# Patient Record
Sex: Female | Born: 1985
Health system: Southern US, Community
[De-identification: ages and names within clinical notes are randomized; demographics above are authoritative.]

## PROBLEM LIST (undated history)

## (undated) DIAGNOSIS — E559 Vitamin D deficiency, unspecified: Secondary | ICD-10-CM

## (undated) DIAGNOSIS — O1495 Unspecified pre-eclampsia, complicating the puerperium: Secondary | ICD-10-CM

## (undated) DIAGNOSIS — E7800 Pure hypercholesterolemia, unspecified: Secondary | ICD-10-CM

## (undated) DIAGNOSIS — F319 Bipolar disorder, unspecified: Secondary | ICD-10-CM

## (undated) DIAGNOSIS — Z803 Family history of malignant neoplasm of breast: Secondary | ICD-10-CM

## (undated) DIAGNOSIS — F419 Anxiety disorder, unspecified: Secondary | ICD-10-CM

## (undated) DIAGNOSIS — M791 Myalgia, unspecified site: Secondary | ICD-10-CM

## (undated) DIAGNOSIS — J302 Other seasonal allergic rhinitis: Secondary | ICD-10-CM

## (undated) DIAGNOSIS — I82402 Acute embolism and thrombosis of unspecified deep veins of left lower extremity: Secondary | ICD-10-CM

## (undated) DIAGNOSIS — Z1379 Encounter for other screening for genetic and chromosomal anomalies: Secondary | ICD-10-CM

## (undated) HISTORY — DX: Myalgia, unspecified site: M79.10

## (undated) HISTORY — DX: Encounter for other screening for genetic and chromosomal anomalies: Z13.79

## (undated) HISTORY — DX: Family history of malignant neoplasm of breast: Z80.3

## (undated) HISTORY — DX: Vitamin D deficiency, unspecified: E55.9

## (undated) HISTORY — PX: SALPINGECTOMY: SHX328

## (undated) HISTORY — PX: ABDOMINAL HYSTERECTOMY: SHX81

## (undated) HISTORY — DX: Pure hypercholesterolemia, unspecified: E78.00

## (undated) HISTORY — DX: Anxiety disorder, unspecified: F41.9

## (undated) HISTORY — DX: Bipolar disorder, unspecified: F31.9

## (undated) HISTORY — PX: TONSILLECTOMY AND ADENOIDECTOMY: SHX28

---
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
## 2007-01-19 ENCOUNTER — Emergency Department (HOSPITAL_COMMUNITY): Admission: EM | Admit: 2007-01-19 | Discharge: 2007-01-20 | Payer: Self-pay | Admitting: Emergency Medicine

## 2008-02-08 ENCOUNTER — Emergency Department (HOSPITAL_COMMUNITY): Admission: EM | Admit: 2008-02-08 | Discharge: 2008-02-08 | Payer: Self-pay | Admitting: Emergency Medicine

## 2008-07-28 ENCOUNTER — Emergency Department (HOSPITAL_COMMUNITY): Admission: EM | Admit: 2008-07-28 | Discharge: 2008-07-28 | Payer: Self-pay | Admitting: Emergency Medicine

## 2009-07-12 ENCOUNTER — Emergency Department (HOSPITAL_COMMUNITY): Admission: EM | Admit: 2009-07-12 | Discharge: 2009-07-12 | Payer: Self-pay | Admitting: Emergency Medicine

## 2009-12-04 ENCOUNTER — Emergency Department (HOSPITAL_COMMUNITY): Admission: EM | Admit: 2009-12-04 | Discharge: 2009-12-05 | Payer: Self-pay | Admitting: Emergency Medicine

## 2010-02-01 ENCOUNTER — Emergency Department (HOSPITAL_COMMUNITY)
Admission: EM | Admit: 2010-02-01 | Discharge: 2010-02-01 | Payer: Self-pay | Source: Home / Self Care | Admitting: Emergency Medicine

## 2010-04-26 ENCOUNTER — Emergency Department (HOSPITAL_COMMUNITY)
Admission: EM | Admit: 2010-04-26 | Discharge: 2010-04-26 | Payer: Self-pay | Source: Home / Self Care | Admitting: Emergency Medicine

## 2010-07-25 LAB — URINALYSIS, ROUTINE W REFLEX MICROSCOPIC
Hgb urine dipstick: NEGATIVE
Ketones, ur: NEGATIVE mg/dL
Urobilinogen, UA: 0.2 mg/dL (ref 0.0–1.0)
pH: 6 (ref 5.0–8.0)

## 2010-07-25 LAB — BASIC METABOLIC PANEL
BUN: 22 mg/dL (ref 6–23)
CO2: 26 mEq/L (ref 19–32)
Calcium: 8.7 mg/dL (ref 8.4–10.5)
Chloride: 101 mEq/L (ref 96–112)
Creatinine, Ser: 0.95 mg/dL (ref 0.4–1.2)
GFR calc non Af Amer: >60 mL/min (ref >60)
Glucose, Bld: 110 mg/dL — ABNORMAL HIGH (ref 70–99)
Potassium: 3.4 mEq/L — ABNORMAL LOW (ref 3.5–5.1)

## 2010-07-25 LAB — HEPATIC FUNCTION PANEL
Alkaline Phosphatase: 33 U/L — ABNORMAL LOW (ref 39–117)
Bilirubin, Direct: 0.1 mg/dL (ref 0.0–0.3)
Indirect Bilirubin: 0.4 mg/dL (ref 0.3–0.9)
Total Bilirubin: 0.5 mg/dL (ref 0.3–1.2)

## 2010-07-25 LAB — DIFFERENTIAL
Eosinophils Absolute: 0 10*3/uL (ref 0.0–0.7)
Eosinophils Relative: 1 % (ref 0–5)
Lymphs Abs: 0.8 10*3/uL (ref 0.7–4.0)
Monocytes Relative: 10 % (ref 3–12)
Neutro Abs: 1.9 10*3/uL (ref 1.7–7.7)
Neutrophils Relative %: 63 % (ref 43–77)

## 2010-07-25 LAB — CBC
MCV: 93.9 fL (ref 78.0–100.0)
Platelets: 184 10*3/uL (ref 150–400)
WBC: 3.1 10*3/uL — ABNORMAL LOW (ref 4.0–10.5)

## 2010-07-25 LAB — POCT PREGNANCY, URINE: Preg Test, Ur: NEGATIVE

## 2010-07-25 LAB — URINE MICROSCOPIC-ADD ON

## 2010-07-25 LAB — LIPASE, BLOOD: Lipase: 25 U/L (ref 11–59)

## 2010-07-27 LAB — URINALYSIS, ROUTINE W REFLEX MICROSCOPIC
Glucose, UA: NEGATIVE mg/dL
Hgb urine dipstick: NEGATIVE
Protein, ur: NEGATIVE mg/dL
Specific Gravity, Urine: 1.031 — ABNORMAL HIGH (ref 1.005–1.030)
Urobilinogen, UA: 0.2 mg/dL (ref 0.0–1.0)
pH: 6 (ref 5.0–8.0)

## 2010-07-27 LAB — POCT I-STAT, CHEM 8
BUN: 20 mg/dL (ref 6–23)
Calcium, Ion: 1.17 mmol/L (ref 1.12–1.32)
Chloride: 105 mEq/L (ref 96–112)
Creatinine, Ser: 0.7 mg/dL (ref 0.4–1.2)
Glucose, Bld: 82 mg/dL (ref 70–99)
HCT: 42 % (ref 36.0–46.0)
Hemoglobin: 14.3 g/dL (ref 12.0–15.0)
Potassium: 3.6 meq/L (ref 3.5–5.1)
Sodium: 139 meq/L (ref 135–145)
TCO2: 27 mmol/L (ref 0–100)

## 2010-07-27 LAB — URINE MICROSCOPIC-ADD ON

## 2010-07-27 LAB — SAMPLE TO BLOOD BANK

## 2010-07-27 LAB — POCT PREGNANCY, URINE: Preg Test, Ur: NEGATIVE

## 2010-07-29 LAB — DIFFERENTIAL
Basophils Absolute: 0 10*3/uL (ref 0.0–0.1)
Basophils Relative: 0 % (ref 0–1)
Eosinophils Absolute: 0.1 10*3/uL (ref 0.0–0.7)
Lymphs Abs: 1 10*3/uL (ref 0.7–4.0)
Monocytes Relative: 2 % — ABNORMAL LOW (ref 3–12)
Neutrophils Relative %: 89 % — ABNORMAL HIGH (ref 43–77)

## 2010-07-29 LAB — URINALYSIS, ROUTINE W REFLEX MICROSCOPIC
Ketones, ur: NEGATIVE mg/dL
Nitrite: NEGATIVE
Specific Gravity, Urine: 1.017 (ref 1.005–1.030)
pH: 5.5 (ref 5.0–8.0)

## 2010-07-29 LAB — BASIC METABOLIC PANEL
CO2: 31 mEq/L (ref 19–32)
Creatinine, Ser: 0.77 mg/dL (ref 0.4–1.2)
GFR calc Af Amer: >60 mL/min (ref >60)
GFR calc non Af Amer: >60 mL/min (ref >60)
Sodium: 138 mEq/L (ref 135–145)

## 2010-07-29 LAB — CBC
HCT: 37.9 % (ref 36.0–46.0)
Hemoglobin: 13.5 g/dL (ref 12.0–15.0)
MCH: 34.3 pg — ABNORMAL HIGH (ref 26.0–34.0)
WBC: 13.5 10*3/uL — ABNORMAL HIGH (ref 4.0–10.5)

## 2010-07-29 LAB — URINE MICROSCOPIC-ADD ON

## 2010-07-29 LAB — RAPID STREP SCREEN (MED CTR MEBANE ONLY): Streptococcus, Group A Screen (Direct): NEGATIVE

## 2010-10-30 ENCOUNTER — Emergency Department (HOSPITAL_COMMUNITY)
Admission: EM | Admit: 2010-10-30 | Discharge: 2010-10-30 | Disposition: A | Discharge status: Home / Self Care | Payer: Managed Care, Other (non HMO) | Attending: Emergency Medicine | Admitting: Emergency Medicine

## 2010-10-30 DIAGNOSIS — X58XXXA Exposure to other specified factors, initial encounter: Secondary | ICD-10-CM | POA: Insufficient documentation

## 2010-10-30 DIAGNOSIS — L299 Pruritus, unspecified: Secondary | ICD-10-CM | POA: Insufficient documentation

## 2010-10-30 DIAGNOSIS — F319 Bipolar disorder, unspecified: Secondary | ICD-10-CM | POA: Insufficient documentation

## 2010-10-30 DIAGNOSIS — R21 Rash and other nonspecific skin eruption: Secondary | ICD-10-CM | POA: Insufficient documentation

## 2010-10-30 DIAGNOSIS — T7840XA Allergy, unspecified, initial encounter: Secondary | ICD-10-CM | POA: Insufficient documentation

## 2010-10-31 ENCOUNTER — Inpatient Hospital Stay (HOSPITAL_COMMUNITY)
Admission: EM | Admit: 2010-10-31 | Discharge: 2010-11-03 | DRG: 607 | Disposition: A | Discharge status: Home / Self Care | Payer: Managed Care, Other (non HMO) | Attending: Internal Medicine | Admitting: Internal Medicine

## 2010-10-31 ENCOUNTER — Emergency Department (HOSPITAL_COMMUNITY)
Admission: EM | Admit: 2010-10-31 | Discharge: 2010-10-31 | Disposition: A | Discharge status: Home / Self Care | Payer: Managed Care, Other (non HMO) | Source: Home / Self Care | Attending: Emergency Medicine | Admitting: Emergency Medicine

## 2010-10-31 DIAGNOSIS — F411 Generalized anxiety disorder: Secondary | ICD-10-CM | POA: Diagnosis present

## 2010-10-31 DIAGNOSIS — T380X5A Adverse effect of glucocorticoids and synthetic analogues, initial encounter: Secondary | ICD-10-CM | POA: Diagnosis present

## 2010-10-31 DIAGNOSIS — L500 Allergic urticaria: Secondary | ICD-10-CM | POA: Insufficient documentation

## 2010-10-31 DIAGNOSIS — E876 Hypokalemia: Secondary | ICD-10-CM | POA: Diagnosis not present

## 2010-10-31 DIAGNOSIS — R7309 Other abnormal glucose: Secondary | ICD-10-CM | POA: Diagnosis present

## 2010-10-31 DIAGNOSIS — F319 Bipolar disorder, unspecified: Secondary | ICD-10-CM | POA: Diagnosis present

## 2010-11-01 LAB — BASIC METABOLIC PANEL
BUN: 18 mg/dL (ref 6–23)
BUN: 19 mg/dL (ref 6–23)
Creatinine, Ser: 0.59 mg/dL (ref 0.50–1.10)
Creatinine, Ser: 0.59 mg/dL (ref 0.50–1.10)
GFR calc Af Amer: >60 mL/min (ref >60)
GFR calc Af Amer: >60 mL/min (ref >60)
GFR calc non Af Amer: >60 mL/min (ref >60)
GFR calc non Af Amer: >60 mL/min (ref >60)
Potassium: 3.5 mEq/L (ref 3.5–5.1)

## 2010-11-01 LAB — DIFFERENTIAL
Basophils Absolute: 0 10*3/uL (ref 0.0–0.1)
Basophils Absolute: 0 10*3/uL (ref 0.0–0.1)
Basophils Relative: 0 % (ref 0–1)
Basophils Relative: 0 % (ref 0–1)
Eosinophils Absolute: 0 10*3/uL (ref 0.0–0.7)
Eosinophils Absolute: 0 10*3/uL (ref 0.0–0.7)
Eosinophils Relative: 0 % (ref 0–5)
Monocytes Relative: 4 % (ref 3–12)
Neutro Abs: 10.8 10*3/uL — ABNORMAL HIGH (ref 1.7–7.7)

## 2010-11-01 LAB — CBC
HCT: 36.5 % (ref 36.0–46.0)
MCHC: 35.6 g/dL (ref 30.0–36.0)
MCV: 92.9 fL (ref 78.0–100.0)
MCV: 94.6 fL (ref 78.0–100.0)
Platelets: 178 10*3/uL (ref 150–400)
Platelets: 205 10*3/uL (ref 150–400)
RDW: 11.4 % — ABNORMAL LOW (ref 11.5–15.5)
RDW: 11.5 % (ref 11.5–15.5)
WBC: 12.7 10*3/uL — ABNORMAL HIGH (ref 4.0–10.5)

## 2010-11-02 LAB — GLUCOSE, CAPILLARY: Glucose-Capillary: 122 mg/dL — ABNORMAL HIGH (ref 70–99)

## 2010-11-03 LAB — CBC
HCT: 35.7 % — ABNORMAL LOW (ref 36.0–46.0)
Hemoglobin: 12.1 g/dL (ref 12.0–15.0)
MCV: 96.5 fL (ref 78.0–100.0)
RBC: 3.7 MIL/uL — ABNORMAL LOW (ref 3.87–5.11)
RDW: 11.5 % (ref 11.5–15.5)
WBC: 7.6 10*3/uL (ref 4.0–10.5)

## 2010-11-03 LAB — GLUCOSE, CAPILLARY: Glucose-Capillary: 140 mg/dL — ABNORMAL HIGH (ref 70–99)

## 2010-11-03 LAB — BASIC METABOLIC PANEL
BUN: 13 mg/dL (ref 6–23)
Chloride: 105 mEq/L (ref 96–112)
GFR calc Af Amer: >60 mL/min (ref >60)
Potassium: 4.3 mEq/L (ref 3.5–5.1)

## 2010-11-03 LAB — DIFFERENTIAL
Eosinophils Relative: 0 % (ref 0–5)
Lymphocytes Relative: 10 % — ABNORMAL LOW (ref 12–46)
Lymphs Abs: 0.8 10*3/uL (ref 0.7–4.0)

## 2010-11-07 NOTE — H&P (Signed)
[name], [name] NO.:  [id number]  MEDICAL RECORD NO.:  [id number]  LOCATION:  IC04                          FACILITY:  [facility]  PHYSICIAN:  Vania Rea, M.D. DATE OF BIRTH:  [birth year]  DATE OF ADMISSION:  11/01/2010 DATE OF DISCHARGE:  LH                             HISTORY & PHYSICAL   PRIMARY CARE PHYSICIAN:  Madelin Rear. Sherwood Gambler, MD, at [facility].  PSYCHIATRIST:  Dr. Rowland Lathe, [facility] in [place].  CHIEF COMPLAINT:  Persistent allergy.  HISTORY OF PRESENT ILLNESS:  This is a 25 year old Caucasian lady who works as a Fish farm manager who presented to [facility] on October 30, 2010, two days ago complaining of sudden onset of itching and swelling of her skin after wrestling with a dog.  The patient has no prior history of urticaria that she can remember and she has been working with animals for the past 2 years.  At [facility], she received injections of Benadryl and Pepcid and was given a prescription for prednisone 40 mg daily.  The following morning the patient left to work, said she could not breathe, was having tightness and burning inside her lungs.  EMS was called and she received Zantac and Benadryl intravenously en route to the emergency room.  She was seen at the [facility] Emergency Room.  She was found to be having no objective respiratory distress, although subjectively she complained of being short of breath.  She had no oropharyngeal edema.  It was confirmed that she had received antihistamines that day, but she had not taken any prednisone that morning.  She was reassured, advised to continue prednisone, Benadryl, and Pepcid and to have outpatient followup for allergy testing.  The patient later that day continued to feel worse, continued to have itching and hives, and went to her primary care physician's office, Dr. Sherwood Gambler where she was evaluated, given a shot with an EpiPen and she said she  felt much better after that and she was discharged home, but later the same evening again after feeling very sick, had more hive, swelling, and itching and returned to the emergency room once again for evaluation.  The patient has been given intravenous Benadryl and the Hospitalist Service has been called to assist with management.  The patient insists she has been taking the medications as prescribed. She does, however, note that she has an allergy to STEROIDS, particular CORTISONE.  She says it was many years ago and she does not know the details.  The computer also lists her as having an allergy to the NEUROMUSCULAR BLOCKING DRUGS, but she knows nothing of the details of this.  She denies any fever or cough.  There is no history of cyanosis.  The patient lives with her mother who is currently on vacation in [place] and with this visit she is being attended by her godmother.  PAST MEDICAL HISTORY:  Bipolar disorder.  MEDICATIONS:  Abilify 30 mg each evening and Trileptal 1200 mg each evening.  ALLERGIES:  Allergies to STEROIDS and Neuromuscular Blocking Drugs.  SOCIAL HISTORY:  Denies tobacco, alcohol, or illicit drug use.  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  FAMILY HISTORY:  She denies any family medical problems at all.  REVIEW OF SYSTEMS:  Other than noted above unremarkable.  PHYSICAL EXAMINATION:  GENERAL:  Very anxious looking young Caucasian lady insisting that she is getting worse. VITAL SIGNS:  Her temperature is 97.9, pulse 88, respirations 16, and blood pressure 120/49.  She is saturating at 100% on room air. HEENT:  Her pupils are round and equal.  Mucous membranes pink. Anicteric.  She has no oropharyngeal edema. NECK:  No cervical lymphadenopathy.  No thyromegaly.  No carotid bruit. CHEST:  Clear to auscultation bilaterally. PSYCH:  She speaks comfortably in long sentences, although she is anxious. CARDIOVASCULAR:  She has a regular rhythm and  a 3/6 rough systolic murmur at the apex radiating to right upper sternal border. ABDOMEN:  Scaphoid, soft, and nontender. EXTREMITIES:  Without edema.  She has 2+ dorsalis pedis pulses bilaterally. SKIN:  She has generalized urticaria, more pronounced on her face and generalized erythema more pronounced on her limbs. CENTRAL NERVOUS SYSTEM:  Cranial nerves II-XII are grossly intact.  She has no focal lateralizing signs.  LABORATORY DATA:  Her white count is 12.6, hemoglobin 13.0, and platelets 205.  She has 86% neutrophils.  Absolute neutrophil count is 10.8.  She has no detected eosinophils.  Her sodium is 136, potassium 3.5, chloride 101, CO2 of 24, glucose 136, BUN 9, creatinine 0.59, and calcium 9.8.  No imaging studies have been done.  ASSESSMENT: 1. Acute allergic reaction, symptoms probably aggravated by anxiety. 2. History of STEROID allergy, although allergic reaction is usually     psychiatric symptoms.  It is possible that her allergic reaction is     currently being aggravated by prednisone. 3. Bipolar disorder, currently with acute anxiety.  PLAN: 1. We will bring this lady on observation.  We will withhold any     steroids at this time, but will give her scheduled H1 and H2     blockers.  We will monitor her in the Step-Down Unit in case     development of any respiratory     distress.  If she demonstrates that she can be controlled with H1     and H2 blockers, she can likely be discharged home without any     steroids, to follow up with an allergist for allergy testing. 2. Other plans as per orders.     Vania Rea, M.D.     LC/MEDQ  D:  11/01/2010  T:  11/01/2010  Job:  [id number]  cc:   Madelin Rear. Sherwood Gambler, MD Fax: [phone]  Electronically Signed by Vania Rea M.D. on 11/07/2010 04:54:29 AM

## 2010-11-07 NOTE — Discharge Summary (Signed)
[name], [name]                  ACCOUNT NO.:  [id number]  MEDICAL RECORD NO.:  [id number]  LOCATION:  [id number]                          FACILITY:  [facility]  PHYSICIAN:  Elliot Cousin, M.D.    DATE OF BIRTH:  1986/05/06  DATE OF ADMISSION:  10/31/2010 DATE OF DISCHARGE:  06/22/2012LH                              DISCHARGE SUMMARY   DISCHARGE DIAGNOSES: 1. Recurrent allergic reaction manifested by pruritus and urticaria. 2. Hypokalemia, repleted. 3. Bipolar disorder with anxiety, remained stable. 4. Mild hyperglycemia, steroid-induced.  DISCHARGE MEDICATIONS: 1. EpiPen to be taken as directed. 2. Hydroxyzine 50 mg every 6 hours until your symptoms completely go     away or per the recommendations of the allergist. 3. Medrol taper with 4 mg tablets to be tapered over the next 6 days. 4. Pepcid 10 mg two tablets b.i.d. for 6 more days or as directed. 5. Abilify 30 mg each evening. 6. Trileptal 600 mg 2 tablets every evening.  DISCHARGE DISPOSITION:  The patient was discharged to home in improved and stable condition on November 03, 2010.  She will follow up with allergist, Dr. Beaulah Dinning on Monday November 06, 2010, at 8:30 a.m.  CONSULTATIONS:  None.  PROCEDURE PERFORMED:  None.  HISTORY OF PRESENT ILLNESS:  The patient is a 25 year old woman with a past medical history significant for bipolar disorder, who presented to the emergency department on October 31, 2010, with a chief complaint of recurrent urticarial rash and itching.  The patient initially presented to the emergency department on October 30, 2010, complaining of a sudden onset of itching and swelling of her skin after wrestling with a dog at the veterinarian.  The patient is employed as a Patent attorney. The dog also scratched her.  Prior to this interaction, she had no prior history of allergic reactions of any sort.  In fact, she has a dog.  She has been working as a Patent attorney for the past 2 years.  In the  emergency department initially, she received injections of Benadryl and Pepcid.  She was given a prescription for prednisone taper.  The following morning, she complained of shortness of breath and tightness and burning in her lungs.  EMS was called.  She received Zantac and Benadryl intravenously en route to the emergency department.  She was taken to [facility] Emergency Department. There was no evidence of oropharyngeal edema.  She was given reassurance and advised to continue prednisone, Benadryl, and Pepcid which had been prescribed the previous day.  Later on that day, she continued to have itching and hives.  She presented to her primary care physician Dr. Sherwood Gambler.  He gave her a shot of an EpiPen.  She felt better and returned home.  But later in the evening, her symptoms recurred and she presented again to the emergency department at [facility].  For further details please see the dictated history and physical.  HOSPITAL COURSE:  The patient was once again started on standard treatment for allergic reactions with intravenous Pepcid 20 mg IV q.12 h, intravenous Benadryl 25 mg IV q.6 h, and Solu-Medrol 60 mg IV q.12 h. Benadryl was eventually discontinued in  favor of hydroxyzine. Hydroxyzine was given orally at 50 mg every 6 hours.  Over the course of the hospitalization, the extent of her urticarial lesions and pruritus subsided and then eventually resolved.  At the time of discharge, she had no evidence of urticaria, although there were a few macular areas on both feet.  There was no evidence of oropharyngeal edema or erythema.  The urticarial rash on her legs, on her face and on her torso completely resolved prior to discharge.  Due to the treatment with Solu-Medrol, her capillary blood glucose did increase.  She was treated appropriately with sliding scale NovoLog. She was also noted to have hypokalemia.  She was repleted orally.  Prior to discharge, her  serum potassium improved to 4.3.  She has chronic anxiety with bipolar disorder.  She was maintained on her chronic medications.  There was no exacerbation or decompensation of her bipolar disorder.  The patient does not know what caused the recurrent or persistent allergic reaction manifested by pruritus and urticaria.  She stated that her symptoms started after she was scratched by a dog at the veterinarian's office.  She had never been allergic to dogs before as she has a dog.  She is now concerned that she may be allergic to dogs.  An appointment was made for her to follow up with  allergist, Dr. Beaulah Dinning, in 3 days.  In the meantime, the she was advised to not return to work until she is evaluated by Dr. Beaulah Dinning.  She was discharged on a Medrol taper, hydroxyzine, and Pepcid.  She was also given a prescription for an EpiPen.     Elliot Cousin, M.D.     DF/MEDQ  D:  11/03/2010  T:  11/04/2010  Job:  [id number]  cc:   Dr. Beaulah Dinning  Electronically Signed by Elliot Cousin M.D. on 11/07/2010 11:21:39 AM

## 2011-02-23 ENCOUNTER — Other Ambulatory Visit (HOSPITAL_COMMUNITY): Payer: Self-pay | Admitting: Family Medicine

## 2011-02-23 ENCOUNTER — Ambulatory Visit (HOSPITAL_COMMUNITY)
Admission: RE | Admit: 2011-02-23 | Discharge: 2011-02-23 | Disposition: A | Discharge status: Home / Self Care | Payer: Managed Care, Other (non HMO) | Source: Ambulatory Visit | Attending: Family Medicine | Admitting: Family Medicine

## 2011-02-23 ENCOUNTER — Emergency Department (HOSPITAL_COMMUNITY)
Admission: EM | Admit: 2011-02-23 | Discharge: 2011-02-23 | Disposition: A | Discharge status: Home / Self Care | Payer: Managed Care, Other (non HMO) | Attending: Emergency Medicine | Admitting: Emergency Medicine

## 2011-02-23 ENCOUNTER — Encounter: Payer: Self-pay | Admitting: *Deleted

## 2011-02-23 DIAGNOSIS — N898 Other specified noninflammatory disorders of vagina: Secondary | ICD-10-CM

## 2011-02-23 DIAGNOSIS — R10819 Abdominal tenderness, unspecified site: Secondary | ICD-10-CM | POA: Insufficient documentation

## 2011-02-23 DIAGNOSIS — R109 Unspecified abdominal pain: Secondary | ICD-10-CM | POA: Insufficient documentation

## 2011-02-23 DIAGNOSIS — R197 Diarrhea, unspecified: Secondary | ICD-10-CM | POA: Insufficient documentation

## 2011-02-23 LAB — DIFFERENTIAL
Basophils Relative: 0 % (ref 0–1)
Eosinophils Absolute: 0.1 10*3/uL (ref 0.0–0.7)
Eosinophils Relative: 2 % (ref 0–5)
Monocytes Relative: 6 % (ref 3–12)
Neutrophils Relative %: 61 % (ref 43–77)

## 2011-02-23 LAB — URINALYSIS, ROUTINE W REFLEX MICROSCOPIC
Bilirubin Urine: NEGATIVE
Bilirubin Urine: NEGATIVE
Glucose, UA: NEGATIVE
Glucose, UA: NEGATIVE mg/dL
Ketones, ur: NEGATIVE
Leukocytes, UA: NEGATIVE
Nitrite: NEGATIVE
Protein, ur: NEGATIVE
Specific Gravity, Urine: 1.025
Specific Gravity, Urine: 1.02 (ref 1.005–1.030)
Urobilinogen, UA: 0.2
Urobilinogen, UA: 0.2 mg/dL (ref 0.0–1.0)
pH: 6

## 2011-02-23 LAB — COMPREHENSIVE METABOLIC PANEL
Albumin: 4 g/dL (ref 3.5–5.2)
Alkaline Phosphatase: 37 U/L — ABNORMAL LOW (ref 39–117)
BUN: 13 mg/dL (ref 6–23)
Calcium: 9.4 mg/dL (ref 8.4–10.5)
GFR calc Af Amer: >90 mL/min (ref >90)
Potassium: 3.8 mEq/L (ref 3.5–5.1)
Total Protein: 7 g/dL (ref 6.0–8.3)

## 2011-02-23 LAB — CBC
Hemoglobin: 13 g/dL (ref 12.0–15.0)
MCH: 32.7 pg (ref 26.0–34.0)
MCHC: 34.1 g/dL (ref 30.0–36.0)
MCV: 96 fL (ref 78.0–100.0)
Platelets: 225 10*3/uL (ref 150–400)

## 2011-02-23 LAB — D-DIMER, QUANTITATIVE: D-Dimer, Quant: <0.22

## 2011-02-23 LAB — URINE MICROSCOPIC-ADD ON

## 2011-02-23 LAB — WET PREP, GENITAL
Trich, Wet Prep: NONE SEEN
Yeast Wet Prep HPF POC: NONE SEEN

## 2011-02-23 LAB — I-STAT 8, (EC8 V) (CONVERTED LAB)
BUN: 13
Bicarbonate: 24
HCT: 38
Hemoglobin: 12.9
Operator id: 277751
pCO2, Ven: 36.4 — ABNORMAL LOW
pH, Ven: 7.427 — ABNORMAL HIGH

## 2011-02-23 LAB — LIPASE, BLOOD: Lipase: 28 U/L (ref 11–59)

## 2011-02-23 LAB — PREGNANCY, URINE: Preg Test, Ur: NEGATIVE

## 2011-02-23 LAB — POCT PREGNANCY, URINE
Operator id: 277751
Preg Test, Ur: NEGATIVE

## 2011-02-23 LAB — POCT I-STAT CREATININE: Creatinine, Ser: 0.9

## 2011-02-23 IMAGING — CR DG ABDOMEN ACUTE W/ 1V CHEST
4 series · 4 of 4 positions shown · non-contrast
Comparison: CT abdomen pelvis of [DATE] and chest x-ray of
[DATE]

CLINICAL DATA: Mid to lower abdomen pain for 3-4 weeks

ACUTE ABDOMEN SERIES (ABDOMEN 2 VIEW & CHEST 1 VIEW)

[view not recorded (1 of 4)]
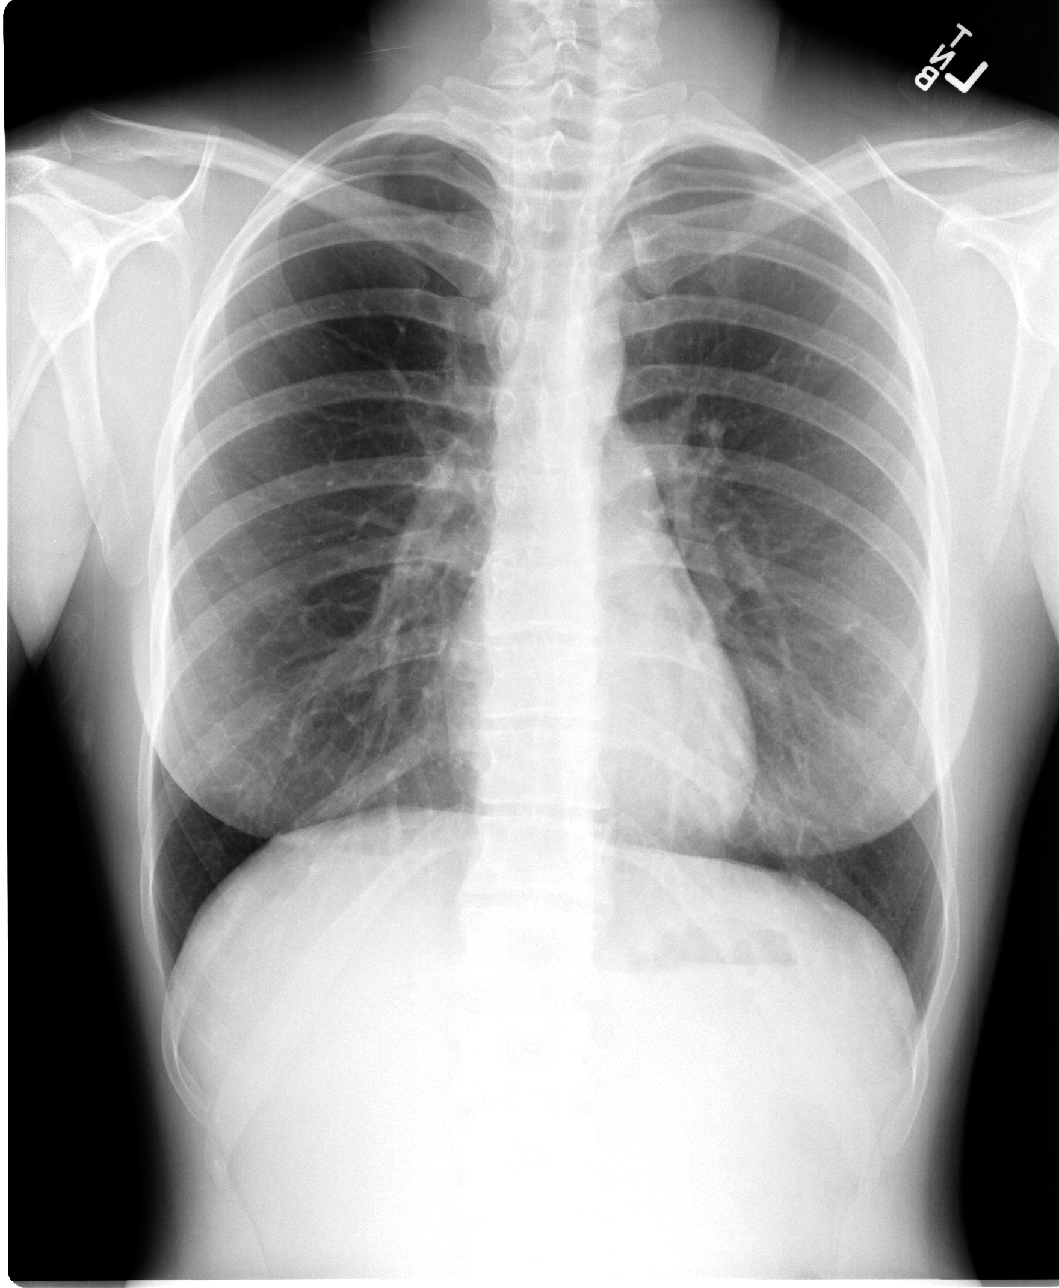

[view not recorded (2 of 4)]
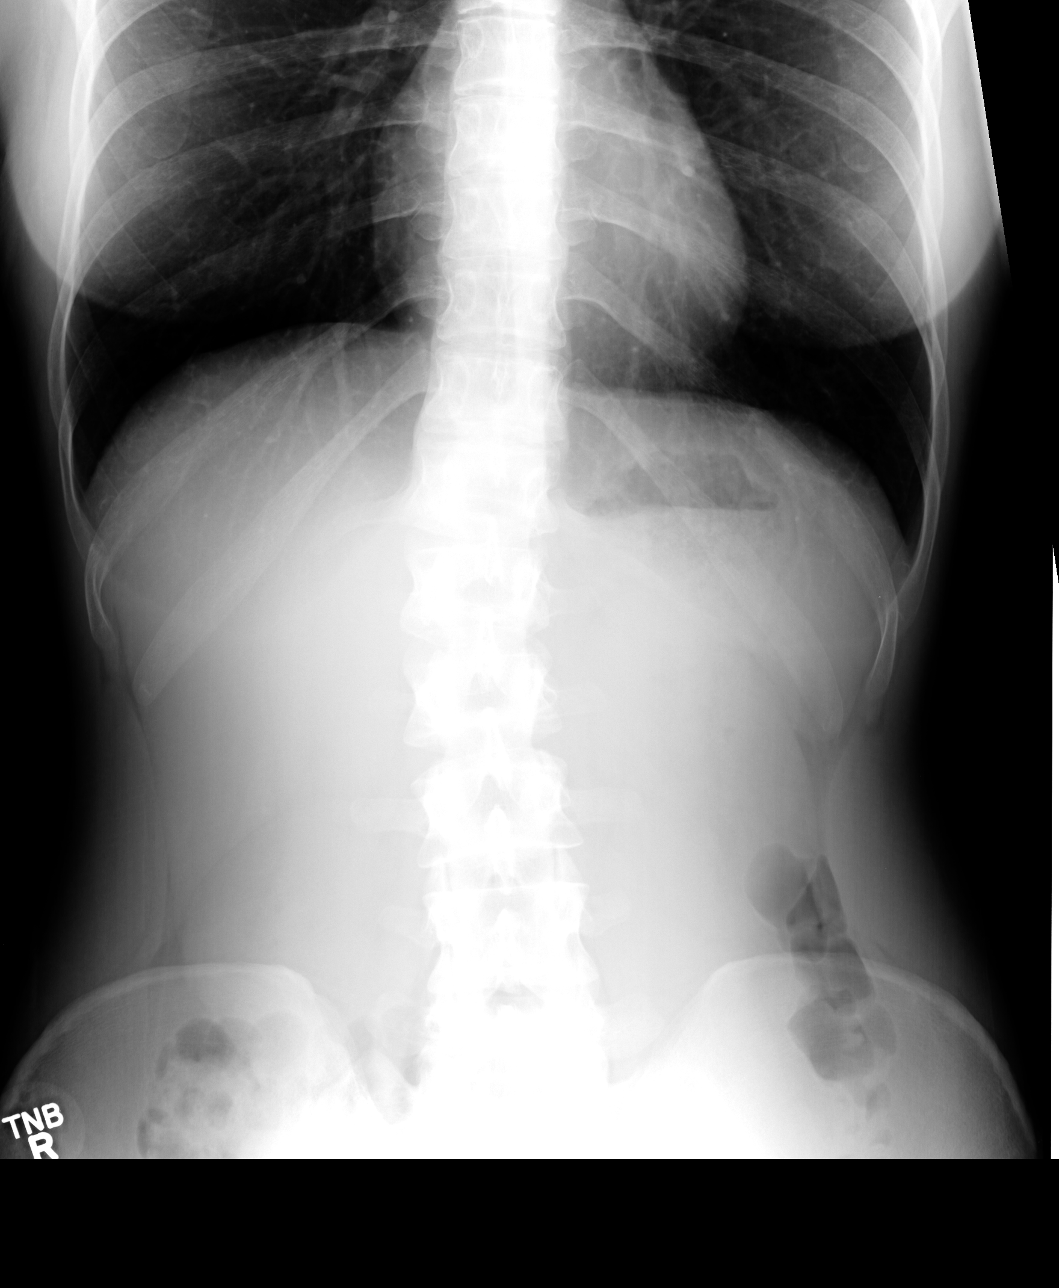

[view not recorded (3 of 4)]
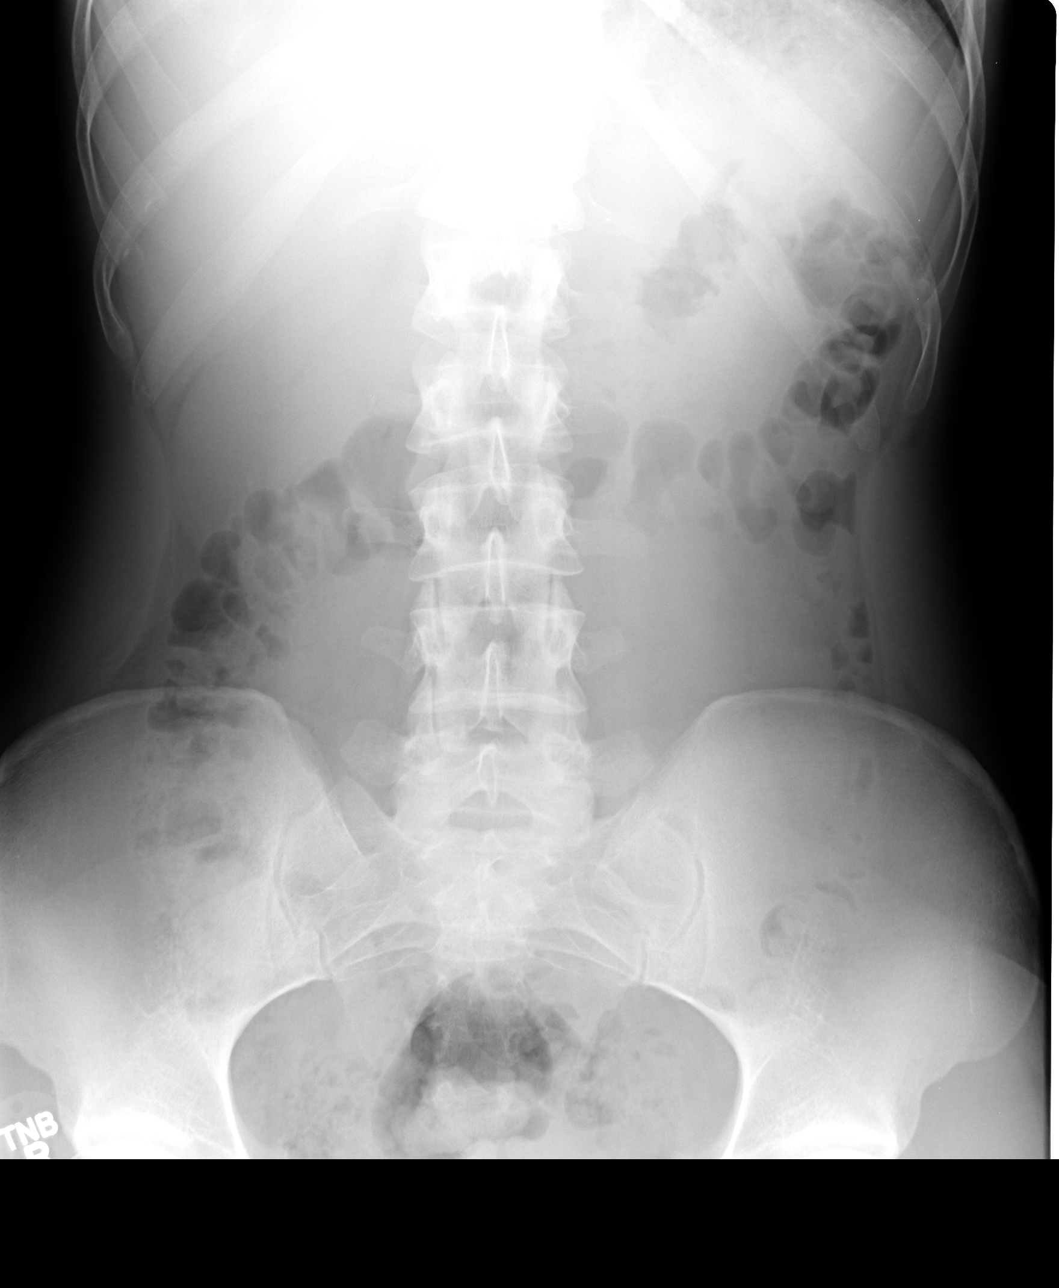

[view not recorded (4 of 4)]
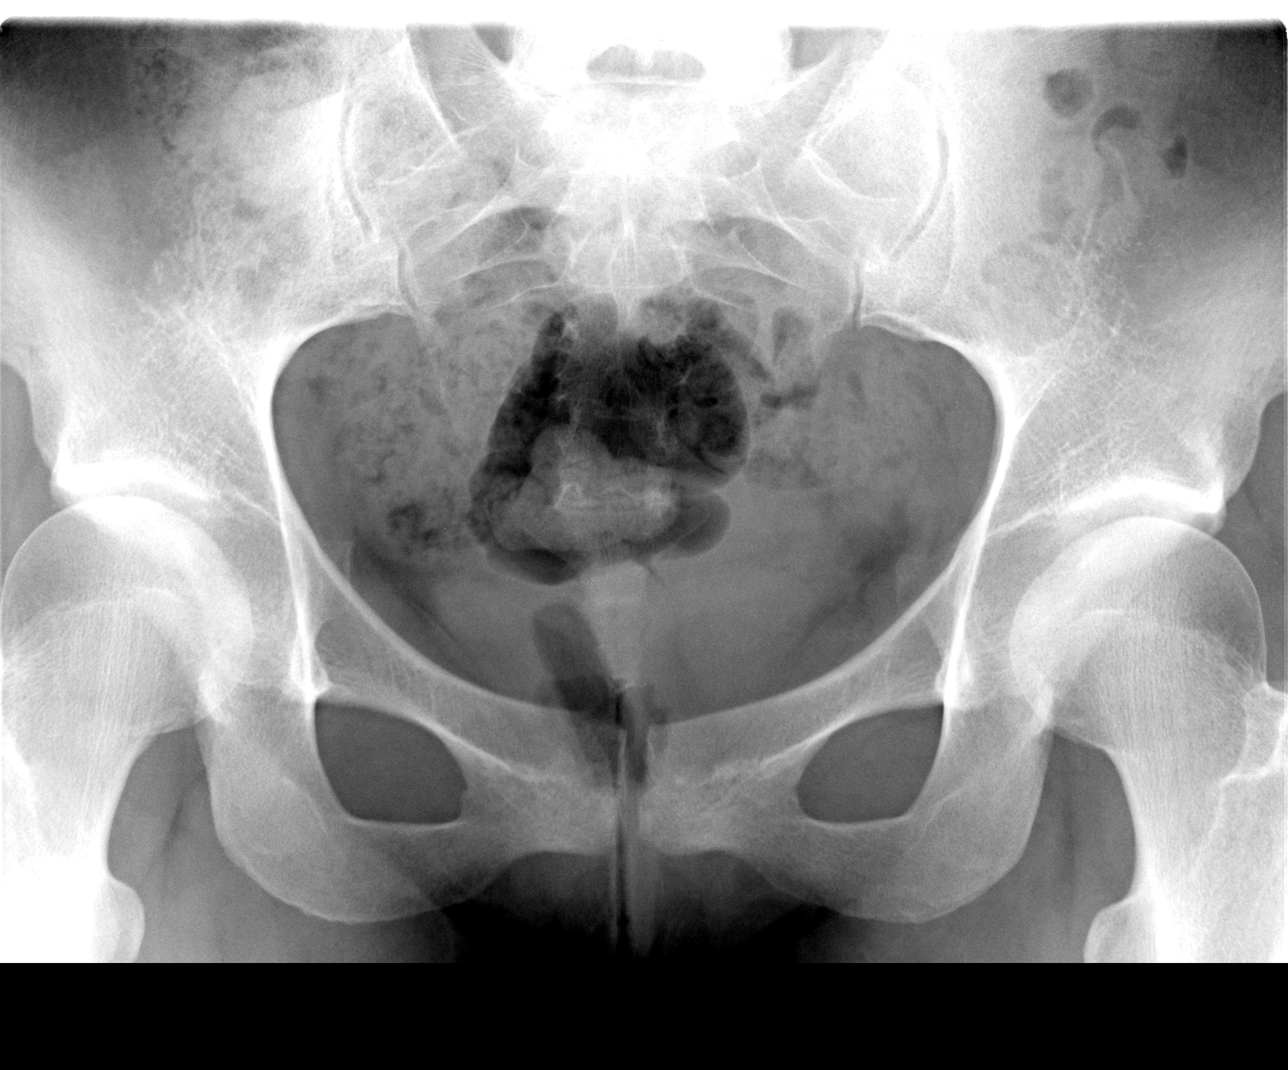

[4 of 4 positions shown; findings below may reference images not displayed]

FINDINGS: The lungs are clear.  Mediastinal contours are stable.
The heart is within normal limits in size.  No bony abnormality is
seen.

Supine and erect views the abdomen show no bowel obstruction.  No
free air is seen.  No opaque calculi are noted.
IMPRESSION: 1.  No active lung disease.
2.  No bowel obstruction.  No free air.

## 2011-02-23 MED ORDER — OXYCODONE-ACETAMINOPHEN 5-325 MG PO TABS: 2.0000 | ORAL_TABLET | ORAL | Status: AC | PRN

## 2011-02-23 NOTE — ED Provider Notes (Addendum)
Scribed for [employer]. Gina Mccort, MD, the patient was seen in room APA01/APA01. This chart was scribed by [employer]. The patient's care started at 19:19  CSN: [id number] Arrival date & time: 02/23/2011  7:09 PM  Chief Complaint  Patient presents with  . Abdominal Pain   HPI [name] is a 25 y.o. female who presents to the Emergency Department complaining of Abdominal pain. Pain started about 4 weeks ago. Pain begins in the left flank and radiates diffusely through out the abdomen. Was evaluated at [facility] without any significant abnormalities. Abdominal pain is intermittent but has been persistent recently. Denies nausea, vomiting, but reports mild diarrhea. Reports taking tramadol and ibuprofen without alleviation. Reports constant vaginal bleeding and discharge for 4 weeks. Reports a history of anaphylactic shock from unknown sources. Reports mild alcohol use. Pain is not modified by alcohol use. Denies possibility of pregnancy.   History reviewed. No pertinent past medical history.  History reviewed. No pertinent past surgical history.  No family history on file.  History  Substance Use Topics  . Smoking status: Never Smoker   . Smokeless tobacco: Not on file  . Alcohol Use: Yes     occasionally    OB History    Grav Para Term Preterm Abortions TAB SAB Ect Mult Living                  Review of Systems  Gastrointestinal: Positive for diarrhea. Negative for nausea and vomiting.  Genitourinary: Positive for vaginal bleeding and vaginal discharge.  All other systems reviewed and are negative.    Allergies  Review of patient's allergies indicates no known allergies.  Home Medications  No current outpatient prescriptions on file.  BP 141/61  Pulse 70  Temp(Src) 98.5 F (36.9 C) (Oral)  Resp 16  Ht 5\' 4"  (1.626 m)  Wt 130 lb (58.968 kg)  BMI 22.31 kg/m2  SpO2 100%  LMP 02/23/2011  Physical Exam  Nursing note and vitals reviewed. Constitutional: She is  oriented to person, place, and time. She appears well-developed and well-nourished. No distress.  HENT:  Head: Normocephalic and atraumatic.  Right Ear: External ear normal.  Left Ear: External ear normal.  Mouth/Throat: Oropharynx is clear and moist. No oropharyngeal exudate.  Eyes: Conjunctivae and EOM are normal. Right eye exhibits no discharge. Left eye exhibits no discharge. No scleral icterus.  Neck: Neck supple. No tracheal deviation present.  Cardiovascular: Normal rate.   Pulmonary/Chest: Effort normal and breath sounds normal. No stridor. No respiratory distress. She has no wheezes. She has no rales.  Abdominal: Soft. Bowel sounds are normal. She exhibits no distension and no mass. There is tenderness (mild abdominal). There is no rebound and no guarding.  Musculoskeletal: Normal range of motion. She exhibits no edema and no tenderness.  Neurological: She is alert and oriented to person, place, and time. She has normal strength. No cranial nerve deficit or sensory deficit. She displays no seizure activity.  Skin: Skin is warm and dry. No rash noted. No erythema.  Psychiatric: She has a normal mood and affect. Her behavior is normal.  pelvis exam showed some fullness of the cervix. Some bloody cervical discharge no cervical motion tenderness. No adnexal tenderness.  ED Course  Procedures   DIAGNOSTIC STUDIES: Oxygen Saturation is 100% on room air, normal by my interpretation.    COORDINATION OF CARE: 19:25 - EDP examined patient and ordered the following Orders Placed This Encounter  Procedures  . Wet prep, genital  .  Urinalysis with microscopic  . CBC  . Differential  . Comprehensive metabolic panel  . Lipase, blood  . GC/chlamydia probe amp, genital  . Urine microscopic-add on  . Pregnancy, urine  . Pelvic cart     Labs and Radiology   Results for orders placed during the hospital encounter of 02/23/11  URINALYSIS, ROUTINE W REFLEX MICROSCOPIC      Component  Value Range   Color, Urine YELLOW  YELLOW    Appearance CLEAR  CLEAR    Specific Gravity, Urine 1.020  1.005 - 1.030    pH 6.0  5.0 - 8.0    Glucose, UA NEGATIVE  NEGATIVE (mg/dL)   Hgb urine dipstick TRACE (*) NEGATIVE    Bilirubin Urine NEGATIVE  NEGATIVE    Ketones, ur NEGATIVE  NEGATIVE (mg/dL)   Protein, ur NEGATIVE  NEGATIVE (mg/dL)   Urobilinogen, UA 0.2  0.0 - 1.0 (mg/dL)   Nitrite NEGATIVE  NEGATIVE    Leukocytes, UA NEGATIVE  NEGATIVE   CBC      Component Value Range   WBC 5.8  4.0 - 10.5 (K/uL)   RBC 3.97  3.87 - 5.11 (MIL/uL)   Hemoglobin 13.0  12.0 - 15.0 (g/dL)   HCT 44.0  10.2 - 72.5 (%)   MCV 96.0  78.0 - 100.0 (fL)   MCH 32.7  26.0 - 34.0 (pg)   MCHC 34.1  30.0 - 36.0 (g/dL)   RDW 36.6  44.0 - 34.7 (%)   Platelets 225  150 - 400 (K/uL)  DIFFERENTIAL      Component Value Range   Neutrophils Relative 61  43 - 77 (%)   Neutro Abs 3.5  1.7 - 7.7 (K/uL)   Lymphocytes Relative 31  12 - 46 (%)   Lymphs Abs 1.8  0.7 - 4.0 (K/uL)   Monocytes Relative 6  3 - 12 (%)   Monocytes Absolute 0.4  0.1 - 1.0 (K/uL)   Eosinophils Relative 2  0 - 5 (%)   Eosinophils Absolute 0.1  0.0 - 0.7 (K/uL)   Basophils Relative 0  0 - 1 (%)   Basophils Absolute 0.0  0.0 - 0.1 (K/uL)  COMPREHENSIVE METABOLIC PANEL      Component Value Range   Sodium 138  135 - 145 (mEq/L)   Potassium 3.8  3.5 - 5.1 (mEq/L)   Chloride 101  96 - 112 (mEq/L)   CO2 28  19 - 32 (mEq/L)   Glucose, Bld 91  70 - 99 (mg/dL)   BUN 13  6 - 23 (mg/dL)   Creatinine, Ser 4.25  0.50 - 1.10 (mg/dL)   Calcium 9.4  8.4 - 95.6 (mg/dL)   Total Protein 7.0  6.0 - 8.3 (g/dL)   Albumin 4.0  3.5 - 5.2 (g/dL)   AST 15  0 - 37 (U/L)   ALT 13  0 - 35 (U/L)   Alkaline Phosphatase 37 (*) 39 - 117 (U/L)   Total Bilirubin 0.3  0.3 - 1.2 (mg/dL)   GFR calc non Af Amer >90  >90 (mL/min)   GFR calc Af Amer >90  >90 (mL/min)  LIPASE, BLOOD      Component Value Range   Lipase 28  11 - 59 (U/L)  URINE MICROSCOPIC-ADD ON       Component Value Range   Squamous Epithelial / LPF FEW (*) RARE    RBC / HPF 0-2  <3 (RBC/hpf)   Bacteria, UA RARE  RARE    Urine-Other  MUCOUS PRESENT       Dg Abd Acute W/chest  02/23/2011  *RADIOLOGY REPORT*  Clinical Data: Mid to lower abdomen pain for 3-4 weeks  ACUTE ABDOMEN SERIES (ABDOMEN 2 VIEW & CHEST 1 VIEW)  Comparison: CT abdomen pelvis of 04/26/2010 and chest x-ray of 01/19/2007  Findings: The lungs are clear.  Mediastinal contours are stable. The heart is within normal limits in size.  No bony abnormality is seen.  Supine and erect views the abdomen show no bowel obstruction.  No free air is seen.  No opaque calculi are noted.  IMPRESSION:  1.  No active lung disease. 2.  No bowel obstruction.  No free air.  Original Report Authenticated By: Juline Patch, M.D.      Abdominal pain for the last couple weeks. Has seen PCP. Labs reassuring. Has ultrasounds scheduled. Will d/c.   I personally performed the services described in this documentation, which was scribed in my presence. The recorded information has been reviewed and considered. Juliet Rude. Rubin Payor, MD  Juliet Rude. Rubin Payor, MD 02/23/11 2210  Juliet Rude. Rubin Payor, MD 02/23/11 2211

## 2011-02-23 NOTE — ED Notes (Signed)
MD at bedside. 

## 2011-02-23 NOTE — ED Notes (Signed)
Pt also states that for the last several months, she has been having her period for 3wks out of the month.

## 2011-02-23 NOTE — ED Notes (Signed)
Pt c/o mid abdominal pain x 2 wks. Denies n/v. Pt states she went to PCP today and was set up for an abdominal ultrasound on Monday. Pt states she was given ibuprofen and tramadol which are not helping her pain.

## 2011-02-23 NOTE — ED Notes (Signed)
C/o abd pain for several weeks, a burning sensation in her abd that started on her left side and now radiates to both side, denies any fever, n/v/d, does admit to urinary issues lately, had xrays done today, scheduled for abd ultrasound on Monday, pain medication given by PA today has not helped with the pain, pt was tramadol and ibu

## 2011-02-26 ENCOUNTER — Other Ambulatory Visit (HOSPITAL_COMMUNITY): Payer: Self-pay | Admitting: Family Medicine

## 2011-02-26 ENCOUNTER — Ambulatory Visit (HOSPITAL_COMMUNITY): Admission: RE | Admit: 2011-02-26 | Payer: Managed Care, Other (non HMO) | Source: Ambulatory Visit

## 2011-02-26 DIAGNOSIS — N898 Other specified noninflammatory disorders of vagina: Secondary | ICD-10-CM

## 2011-02-26 LAB — GC/CHLAMYDIA PROBE AMP, GENITAL
Chlamydia, DNA Probe: NEGATIVE
GC Probe Amp, Genital: NEGATIVE

## 2011-02-27 ENCOUNTER — Ambulatory Visit (HOSPITAL_COMMUNITY)
Admission: RE | Admit: 2011-02-27 | Discharge: 2011-02-27 | Disposition: A | Discharge status: Home / Self Care | Payer: Managed Care, Other (non HMO) | Source: Ambulatory Visit | Attending: Family Medicine | Admitting: Family Medicine

## 2011-02-27 DIAGNOSIS — R109 Unspecified abdominal pain: Secondary | ICD-10-CM | POA: Insufficient documentation

## 2011-02-27 DIAGNOSIS — N898 Other specified noninflammatory disorders of vagina: Secondary | ICD-10-CM | POA: Insufficient documentation

## 2011-04-26 ENCOUNTER — Encounter (HOSPITAL_COMMUNITY): Payer: Self-pay | Admitting: *Deleted

## 2011-04-26 ENCOUNTER — Emergency Department (HOSPITAL_COMMUNITY)
Admission: EM | Admit: 2011-04-26 | Discharge: 2011-04-26 | Disposition: A | Discharge status: Home / Self Care | Payer: Managed Care, Other (non HMO) | Attending: Emergency Medicine | Admitting: Emergency Medicine

## 2011-04-26 DIAGNOSIS — L500 Allergic urticaria: Secondary | ICD-10-CM | POA: Insufficient documentation

## 2011-04-26 DIAGNOSIS — X58XXXA Exposure to other specified factors, initial encounter: Secondary | ICD-10-CM | POA: Insufficient documentation

## 2011-04-26 DIAGNOSIS — R0789 Other chest pain: Secondary | ICD-10-CM | POA: Insufficient documentation

## 2011-04-26 DIAGNOSIS — T7840XA Allergy, unspecified, initial encounter: Secondary | ICD-10-CM | POA: Insufficient documentation

## 2011-04-26 HISTORY — DX: Other seasonal allergic rhinitis: J30.2

## 2011-04-26 MED ORDER — HYDROXYZINE HCL 50 MG/ML IM SOLN
50.0000 mg | Freq: Four times a day (QID) | INTRAMUSCULAR | Status: DC | PRN
  Administered 2011-04-26: 50 mg via INTRAMUSCULAR
  Filled 2011-04-26 (×2): qty 1

## 2011-04-26 MED ORDER — FAMOTIDINE IN NACL 20-0.9 MG/50ML-% IV SOLN
20.0000 mg | Freq: Once | INTRAVENOUS | Status: AC
  Administered 2011-04-26: 20 mg via INTRAVENOUS
  Filled 2011-04-26: qty 50

## 2011-04-26 MED ORDER — EPINEPHRINE 0.3 MG/0.3ML IJ DEVI: 0.3000 mg | INTRAMUSCULAR | Status: DC | PRN

## 2011-04-26 MED ORDER — METHYLPREDNISOLONE SODIUM SUCC 125 MG IJ SOLR
125.0000 mg | Freq: Once | INTRAMUSCULAR | Status: AC
  Administered 2011-04-26: 125 mg via INTRAVENOUS
  Filled 2011-04-26: qty 2

## 2011-04-26 MED ORDER — PREDNISONE 20 MG PO TABS: 20.0000 mg | ORAL_TABLET | Freq: Every day | ORAL | Status: AC

## 2011-04-26 NOTE — ED Notes (Signed)
Pt has red patchy rash to face, chest, and hands.

## 2011-04-26 NOTE — ED Notes (Signed)
Pt given ice pack

## 2011-04-26 NOTE — ED Provider Notes (Signed)
History     CSN: [id number] Arrival date & time: 04/26/2011  4:14 PM   Chief Complaint  Patient presents with  . Allergic Reaction    pt reports hives break out, and chest tightness, states that "I gave myself epi because last time this happened I ended up in ICU for 2 weeks." pt unsure of allergen.     HPI Pt was seen at 1710.  Per pt, c/o gradual onset and persistence of constant "Itching rash" to face, neck, arms, and chest that began PTA.  Pt states she gave herself her epi pen because her chest "started feeling tight."  Has previous hx of same.  Unk allergen.  Continues to c/o itching and rash on arrival to ED.  Denies CP/SOB, no wheezing/stridor, no N/V/D, no syncope.     Past Medical History  Diagnosis Date  . Seasonal allergies     History reviewed. No pertinent past surgical history.   History  Substance Use Topics  . Smoking status: Never Smoker   . Smokeless tobacco: Not on file  . Alcohol Use: Yes     occasionally   Review of Systems ROS: Statement: All systems negative except as marked or noted in the HPI; Constitutional: Negative for fever and chills. ; ; Eyes: Negative for eye pain, redness and discharge. ; ; ENMT: Negative for ear pain, hoarseness, nasal congestion, sinus pressure and sore throat. ; ; Cardiovascular: Negative for chest pain, palpitations, diaphoresis, dyspnea and peripheral edema. ; ; Respiratory: Negative for cough, wheezing and stridor. ; ; Gastrointestinal: Negative for nausea, vomiting, diarrhea, abdominal pain, blood in stool, hematemesis, jaundice and rectal bleeding. . ; ; Genitourinary: Negative for dysuria, flank pain and hematuria. ; ; Musculoskeletal: Negative for back pain and neck pain. Negative for swelling and trauma.; ; Skin: +hives, itching. Negative for abrasions, blisters, bruising and skin lesion.; ; Neuro: Negative for headache, lightheadedness and neck stiffness. Negative for weakness, altered level of consciousness , altered  mental status, extremity weakness, paresthesias, involuntary movement, seizure and syncope.       Allergies  Dust mite extract  Home Medications   Current Outpatient Rx  Name Route Sig Dispense Refill  . ARIPIPRAZOLE 30 MG PO TABS Oral Take 30 mg by mouth every evening.      . ETONOGESTREL 68 MG [place] IMPL Subcutaneous Inject 1 each into the skin once.      Marland Kitchen HYDROXYZINE HCL 50 MG PO TABS Oral Take 100 mg by mouth at bedtime.      Marland Kitchen OXCARBAZEPINE 600 MG PO TABS Oral Take 1,200 mg by mouth at bedtime.        BP 142/75  Pulse 95  Temp(Src) 98.2 F (36.8 C) (Oral)  Resp 20  SpO2 99%  Physical Exam 1715: Physical examination:  Nursing notes reviewed; Vital signs and O2 SAT reviewed;  Constitutional: Well developed, Well nourished, Well hydrated, Uncomfortable, tearful. Anxious.  Head:  Normocephalic, atraumatic; Eyes: EOMI, PERRL, No scleral icterus; ENMT: Mouth and pharynx normal, Mucous membranes moist; Neck: Supple, Full range of motion, No lymphadenopathy; Cardiovascular: Regular rate and rhythm, No murmur, rub, or gallop; Respiratory: Breath sounds clear & equal bilaterally, No rales, rhonchi, wheezes, or rub, Normal respiratory effort/excursion.  No wheezing or stridor, speaking full sentences with ease.; Chest: Nontender, Movement normal; Abdomen: Soft, Nontender, Nondistended, Normal bowel sounds; Extremities: Pulses normal, No tenderness, No edema, No calf edema or asymmetry.; Neuro: AA&Ox3, Major CN grossly intact.  No gross focal motor or sensory deficits in extremities.;  Skin: Color normal, Warm, Dry, +diffuse hives and scratching during exam.    ED Course  Procedures   MDM  MDM Reviewed: nursing note, previous chart and vitals   1700:  Pt given vistaril instead of benadryl per her request, states "it worked better" than benadryl when she was hospitalized for same.  Also given pepcid and solu-medrol.  Rechecked.  Pt still scratching at her face, but bilat arms and neck  without rash.  Pt crying with her mother, c/o "my skin is burning all over."  Offered ativan, as has hx bipolar and anxiety esp with steroids.  Pt refuses at this time.  Will continue to monitor.  1845:  Pt sleeping soundly, rash improved.  Resps easy.  Mother at bedside.   2130:  Rash completely improved, no further itching.  Resps continue easy, VSS.  Wants to go home now.  Needs another epi pen rx.  Dx d/w pt and family.  Questions answered.  Verb understanding, agreeable to d/c home with outpt f/u.      Kenna Kirn Allison Quarry, DO 04/28/11 2020

## 2011-05-03 ENCOUNTER — Encounter (HOSPITAL_COMMUNITY): Payer: Self-pay | Admitting: *Deleted

## 2011-05-03 ENCOUNTER — Emergency Department (HOSPITAL_COMMUNITY): Payer: Managed Care, Other (non HMO)

## 2011-05-03 ENCOUNTER — Emergency Department (HOSPITAL_COMMUNITY)
Admission: EM | Admit: 2011-05-03 | Discharge: 2011-05-03 | Disposition: A | Discharge status: Home / Self Care | Payer: Managed Care, Other (non HMO) | Attending: Emergency Medicine | Admitting: Emergency Medicine

## 2011-05-03 DIAGNOSIS — I808 Phlebitis and thrombophlebitis of other sites: Secondary | ICD-10-CM

## 2011-05-03 DIAGNOSIS — M79609 Pain in unspecified limb: Secondary | ICD-10-CM | POA: Insufficient documentation

## 2011-05-03 MED ORDER — IBUPROFEN 800 MG PO TABS
800.0000 mg | ORAL_TABLET | Freq: Three times a day (TID) | ORAL | Status: AC | PRN
Start: 2011-05-03 — End: 2011-05-13

## 2011-05-03 MED ORDER — HYDROCODONE-ACETAMINOPHEN 5-325 MG PO TABS: ORAL_TABLET | ORAL | Status: AC

## 2011-05-03 NOTE — ED Provider Notes (Signed)
History     CSN: [id number]  Arrival date & time 05/03/11  1433   First MD Initiated Contact with Patient 05/03/11 1637      Chief Complaint  Patient presents with  . Arm Pain    (Consider location/radiation/quality/duration/timing/severity/associated sxs/prior treatment) HPI Comments: Patient with pain in left antecubital fossa in area of recent IV. Pain is worse with pronation and supination, flexion of the arm and elbow. She denies erythema or swelling. The patient is worried there is a foreign body in the vein. The area feels hard and cordlike. She denies fever. No treatments prior.  Patient is a 25 y.o. female presenting with arm pain. The history is provided by the patient.  Arm Pain This is a new problem. The current episode started in the past 7 days. The problem occurs constantly. Associated symptoms include myalgias. Pertinent negatives include no arthralgias, fever, joint swelling, numbness or weakness. The symptoms are aggravated by bending and twisting. She has tried nothing for the symptoms.    Past Medical History  Diagnosis Date  . Seasonal allergies     History reviewed. No pertinent past surgical history.  History reviewed. No pertinent family history.  History  Substance Use Topics  . Smoking status: Never Smoker   . Smokeless tobacco: Not on file  . Alcohol Use: Yes     occasionally    OB History    Grav Para Term Preterm Abortions TAB SAB Ect Mult Living                  Review of Systems  Constitutional: Negative for fever.  Musculoskeletal: Positive for myalgias. Negative for joint swelling and arthralgias.  Skin: Negative for color change and wound.  Neurological: Negative for weakness and numbness.    Allergies  Dust mite extract  Home Medications   Current Outpatient Rx  Name Route Sig Dispense Refill  . ARIPIPRAZOLE 30 MG PO TABS Oral Take 30 mg by mouth every evening.      Marland Kitchen EPINEPHRINE 0.3 MG/0.3ML IJ DEVI Intramuscular Inject  0.3 mg into the muscle as needed. Allergic reactions     . HYDROXYZINE HCL 50 MG PO TABS Oral Take 100 mg by mouth every other day.     Marland Kitchen OXCARBAZEPINE 600 MG PO TABS Oral Take 1,200 mg by mouth at bedtime.      Marland Kitchen PREDNISONE 20 MG PO TABS Oral Take 1 tablet (20 mg total) by mouth daily. 10 tablet 0  . ETONOGESTREL 68 MG [place] IMPL Subcutaneous Inject 1 each into the skin once.      Marland Kitchen HYDROCODONE-ACETAMINOPHEN 5-325 MG PO TABS  Take 1-2 tablets every 6 hours as needed for severe pain 8 tablet 0  . IBUPROFEN 800 MG PO TABS Oral Take 1 tablet (800 mg total) by mouth every 8 (eight) hours as needed for pain. 21 tablet 0    BP 116/58  Pulse 75  Temp(Src) 98 F (36.7 C) (Oral)  Resp 18  SpO2 99%  LMP 05/03/2011  Physical Exam  Nursing note and vitals reviewed. Constitutional: She is oriented to person, place, and time. She appears well-developed and well-nourished.  HENT:  Head: Normocephalic and atraumatic.  Eyes: Pupils are equal, round, and reactive to light.  Neck: Normal range of motion. Neck supple.  Musculoskeletal: She exhibits tenderness. She exhibits no edema.       2-3 cm cord palpated in left antecubital fossa -- moderately tender. There is no overlying erythema. Patient has full range of  motion in elbow. 2+ radial pulse in the arm.  Neurological: She is alert and oriented to person, place, and time.       Distal motor, sensation, and vascular intact.   Skin: Skin is warm and dry. No erythema.  Psychiatric: She has a normal mood and affect. Her behavior is normal.    ED Course  Procedures (including critical care time)  Labs Reviewed - No data to display Dg Elbow Complete Left  05/03/2011  *RADIOLOGY REPORT*  Clinical Data: Recent IV placement.  Pain and swelling.  Assess for foreign object.  LEFT ELBOW - COMPLETE 3+ VIEW  Comparison: None.  Findings: The bones appear normal.  No apparent joint effusion.  No discernible radiopaque foreign object.  There is a small  calcification in the triceps tendon not of clinical relevance.  IMPRESSION: No discernible radiopaque foreign object.  Original Report Authenticated By: Thomasenia Sales, M.D.     1. Phlebitis of arm    Pt seen and examined. Pt d/w Dr. Deretha Emory. Patient informed to start on anti-inflammatory medications. Vascular surgery referral given if no improvement in one to 2 weeks. Patient urged to return with redness, swelling, pain, fever or any other concerns.  Patient counseled on use of narcotic pain medications. Counseled not to combine these medications with others containing tylenol. Urged not to drink alcohol, drive, or perform any other activities that requires focus while taking these medications. The patient verbalizes understanding and agrees with the plan.    MDM  Likely phlebitis secondary to recent IV. No concern for soft tissue use infection or abscess. No systemic symptoms.       Eustace Moore [place], [place] 05/03/11 [phone]

## 2011-05-03 NOTE — ED Notes (Signed)
Pt states she had an allergic reaction last week had an iv placed in lac. Pt states is having pain at the iv site. Pt states he vein fells hard

## 2011-05-03 NOTE — ED Notes (Signed)
Pt in c/o pain and hard area at IV site from last week, states she received IV steroids, hard area noted at IV site

## 2011-05-04 NOTE — ED Provider Notes (Signed)
Medical screening examination/treatment/procedure(s) were performed by non-physician practitioner and as supervising physician I was immediately available for consultation/collaboration.   Shelda Jakes, MD 05/04/11 [phone]

## 2011-05-10 ENCOUNTER — Other Ambulatory Visit (HOSPITAL_COMMUNITY): Payer: Self-pay | Admitting: Physician Assistant

## 2011-05-10 ENCOUNTER — Ambulatory Visit (HOSPITAL_COMMUNITY)
Admission: RE | Admit: 2011-05-10 | Discharge: 2011-05-10 | Disposition: A | Discharge status: Home / Self Care | Payer: Managed Care, Other (non HMO) | Source: Ambulatory Visit | Attending: Physician Assistant | Admitting: Physician Assistant

## 2011-05-10 DIAGNOSIS — L988 Other specified disorders of the skin and subcutaneous tissue: Secondary | ICD-10-CM

## 2011-05-10 DIAGNOSIS — M25529 Pain in unspecified elbow: Secondary | ICD-10-CM

## 2011-05-10 DIAGNOSIS — I82619 Acute embolism and thrombosis of superficial veins of unspecified upper extremity: Secondary | ICD-10-CM | POA: Insufficient documentation

## 2011-05-10 DIAGNOSIS — R1012 Left upper quadrant pain: Secondary | ICD-10-CM | POA: Insufficient documentation

## 2011-05-25 ENCOUNTER — Encounter: Payer: Self-pay | Admitting: Vascular Surgery

## 2011-05-26 ENCOUNTER — Emergency Department (HOSPITAL_COMMUNITY)
Admission: EM | Admit: 2011-05-26 | Discharge: 2011-05-26 | Discharge status: Against Medical Advice | Payer: Self-pay | Attending: Emergency Medicine | Admitting: Emergency Medicine

## 2011-05-26 ENCOUNTER — Emergency Department (INDEPENDENT_AMBULATORY_CARE_PROVIDER_SITE_OTHER)
Admission: EM | Admit: 2011-05-26 | Discharge: 2011-05-26 | Disposition: A | Discharge status: Home / Self Care | Payer: Managed Care, Other (non HMO) | Source: Home / Self Care | Attending: Family Medicine | Admitting: Family Medicine

## 2011-05-26 ENCOUNTER — Encounter (HOSPITAL_COMMUNITY): Payer: Self-pay | Admitting: *Deleted

## 2011-05-26 ENCOUNTER — Other Ambulatory Visit: Payer: Self-pay

## 2011-05-26 DIAGNOSIS — K219 Gastro-esophageal reflux disease without esophagitis: Secondary | ICD-10-CM

## 2011-05-26 DIAGNOSIS — I10 Essential (primary) hypertension: Secondary | ICD-10-CM | POA: Insufficient documentation

## 2011-05-26 HISTORY — DX: Acute embolism and thrombosis of unspecified deep veins of left lower extremity: I82.402

## 2011-05-26 MED ORDER — GI COCKTAIL ~~LOC~~
ORAL | Status: AC
  Filled 2011-05-26: qty 30

## 2011-05-26 MED ORDER — GI COCKTAIL ~~LOC~~
30.0000 mL | Freq: Once | ORAL | Status: AC
  Administered 2011-05-26: 30 mL via ORAL

## 2011-05-26 NOTE — ED Notes (Signed)
Pt left against medical advice signed ama form   Advised to go to ed should symptoms worsen or return

## 2011-05-26 NOTE — ED Notes (Signed)
Patient was at Urgent Care for hypertension and was told to be transferred to ED but patient refused and left.  Patient is now still not feeling well so she came for evaluation

## 2011-05-26 NOTE — ED Notes (Signed)
Pt now states she feels better does not want to go to ed explained to pt would require her to sign out against medical advice

## 2011-05-26 NOTE — ED Notes (Signed)
Pain with swallowing relieved - pain with deep breath continues

## 2011-05-26 NOTE — ED Provider Notes (Signed)
History     CSN: [id number]  Arrival date & time 05/26/11  1320   First MD Initiated Contact with Patient 05/26/11 1328      Chief Complaint  Patient presents with  . Anxiety  . Chest Pain  . Hypertension    (Consider location/radiation/quality/duration/timing/severity/associated sxs/prior treatment) Patient is a 26 y.o. female presenting with chest pain. The history is provided by the patient.  Chest Pain The chest pain began 3 - 5 hours ago. Chest pain occurs constantly. The chest pain is unchanged. The severity of the pain is mild. The quality of the pain is described as tightness. The pain radiates to the epigastrium. Pertinent negatives for primary symptoms include no fever, no fatigue, no syncope, no shortness of breath and no palpitations.  Her past medical history is significant for hypertension.     Past Medical History  Diagnosis Date  . Seasonal allergies   . Bipolar disorder   . Myalgia   . Deep vein blood clot of left lower extremity     History reviewed. No pertinent past surgical history.  History reviewed. No pertinent family history.  History  Substance Use Topics  . Smoking status: Never Smoker   . Smokeless tobacco: Not on file  . Alcohol Use: Yes     occasionally    OB History    Grav Para Term Preterm Abortions TAB SAB Ect Mult Living                  Review of Systems  Constitutional: Negative for fever and fatigue.  HENT: Negative.   Respiratory: Negative for shortness of breath.   Cardiovascular: Positive for chest pain. Negative for palpitations and syncope.  Gastrointestinal: Negative.   Genitourinary: Negative.   Musculoskeletal: Negative.     Allergies  Dust mite extract and Cortisone  Home Medications   Current Outpatient Rx  Name Route Sig Dispense Refill  . ARIPIPRAZOLE 30 MG PO TABS Oral Take 30 mg by mouth every evening.      Marland Kitchen OXCARBAZEPINE 600 MG PO TABS Oral Take 1,200 mg by mouth at bedtime.      Marland Kitchen EPINEPHRINE  0.3 MG/0.3ML IJ DEVI Intramuscular Inject 0.3 mg into the muscle as needed. Allergic reactions     . ETONOGESTREL 68 MG [place] IMPL Subcutaneous Inject 1 each into the skin once.      Marland Kitchen HYDROCODONE-ACETAMINOPHEN 5-325 MG PO TABS Oral Take 1 tablet by mouth every 6 (six) hours as needed.    Marland Kitchen HYDROXYZINE HCL 50 MG PO TABS Oral Take 100 mg by mouth every other day.     . IBUPROFEN 800 MG PO TABS Oral Take 800 mg by mouth every 8 (eight) hours as needed.    Marland Kitchen PREDNISONE 20 MG PO TABS Oral Take 20 mg by mouth daily.      BP 151/92  Pulse 99  Temp(Src) 98.6 F (37 C) (Oral)  Resp 16  SpO2 99%  LMP 05/03/2011  Physical Exam  Nursing note and vitals reviewed. Constitutional: She appears well-developed and well-nourished.  HENT:  Head: Normocephalic.  Mouth/Throat: Oropharynx is clear and moist.  Eyes: Conjunctivae are normal. Pupils are equal, round, and reactive to light.  Neck: Normal range of motion. Neck supple.  Cardiovascular: Normal rate, normal heart sounds and intact distal pulses.   Pulmonary/Chest: Effort normal and breath sounds normal.  Abdominal: Soft. Bowel sounds are normal. She exhibits no distension and no mass. There is tenderness. There is no rebound and no guarding.  Musculoskeletal: She exhibits tenderness.       Arms: Lymphadenopathy:    She has no cervical adenopathy.    ED Course  Procedures (including critical care time)  Labs Reviewed - No data to display No results found.   1. GERD (gastroesophageal reflux disease)       MDM  Swallowing sx i,proved with GI cocktail, still sx chest tightness,         Barkley Bruns, MD 05/26/11 1448

## 2011-05-26 NOTE — ED Notes (Signed)
Patient feels like her chest is very tight

## 2011-05-26 NOTE — ED Notes (Signed)
Pt c/o not feeling "right" x 3 hours - feels anxious - pain chest with deep breath and when swallowing  - per pt seen and treated for allergic reaction 12/8 IV left antecubital - one week later pt with swollen area - ultrasound 2 weeks ago showed blood clot left antecubital area has appt with vein specialist 1/16  - pt denies sob -

## 2011-05-26 NOTE — ED Notes (Signed)
Patient stated she was feeling better, signed AMA 20:02

## 2011-05-29 ENCOUNTER — Encounter: Payer: Self-pay | Admitting: Vascular Surgery

## 2011-05-30 ENCOUNTER — Encounter: Payer: Managed Care, Other (non HMO) | Admitting: Vascular Surgery

## 2011-09-02 ENCOUNTER — Emergency Department (HOSPITAL_COMMUNITY)
Admission: EM | Admit: 2011-09-02 | Discharge: 2011-09-03 | Disposition: A | Discharge status: Home / Self Care | Payer: BC Managed Care – PPO | Attending: Emergency Medicine | Admitting: Emergency Medicine

## 2011-09-02 ENCOUNTER — Encounter (HOSPITAL_COMMUNITY): Payer: Self-pay | Admitting: Emergency Medicine

## 2011-09-02 DIAGNOSIS — F411 Generalized anxiety disorder: Secondary | ICD-10-CM | POA: Insufficient documentation

## 2011-09-02 DIAGNOSIS — F319 Bipolar disorder, unspecified: Secondary | ICD-10-CM | POA: Insufficient documentation

## 2011-09-02 DIAGNOSIS — Z79899 Other long term (current) drug therapy: Secondary | ICD-10-CM | POA: Insufficient documentation

## 2011-09-02 DIAGNOSIS — IMO0001 Reserved for inherently not codable concepts without codable children: Secondary | ICD-10-CM | POA: Insufficient documentation

## 2011-09-02 DIAGNOSIS — H53149 Visual discomfort, unspecified: Secondary | ICD-10-CM | POA: Insufficient documentation

## 2011-09-02 DIAGNOSIS — R070 Pain in throat: Secondary | ICD-10-CM | POA: Insufficient documentation

## 2011-09-02 DIAGNOSIS — R0789 Other chest pain: Secondary | ICD-10-CM | POA: Insufficient documentation

## 2011-09-02 DIAGNOSIS — J3489 Other specified disorders of nose and nasal sinuses: Secondary | ICD-10-CM | POA: Insufficient documentation

## 2011-09-02 DIAGNOSIS — R110 Nausea: Secondary | ICD-10-CM | POA: Insufficient documentation

## 2011-09-02 DIAGNOSIS — R6883 Chills (without fever): Secondary | ICD-10-CM | POA: Insufficient documentation

## 2011-09-02 DIAGNOSIS — B9789 Other viral agents as the cause of diseases classified elsewhere: Secondary | ICD-10-CM | POA: Insufficient documentation

## 2011-09-02 DIAGNOSIS — B349 Viral infection, unspecified: Secondary | ICD-10-CM

## 2011-09-02 DIAGNOSIS — R05 Cough: Secondary | ICD-10-CM | POA: Insufficient documentation

## 2011-09-02 DIAGNOSIS — H9209 Otalgia, unspecified ear: Secondary | ICD-10-CM | POA: Insufficient documentation

## 2011-09-02 DIAGNOSIS — R10816 Epigastric abdominal tenderness: Secondary | ICD-10-CM | POA: Insufficient documentation

## 2011-09-02 DIAGNOSIS — R059 Cough, unspecified: Secondary | ICD-10-CM | POA: Insufficient documentation

## 2011-09-02 NOTE — ED Notes (Signed)
Pt presented to the Er with c/o left ear pain, pt reports begin woken up by pain

## 2011-09-02 NOTE — ED Notes (Signed)
Pt alert, nad, c/o left ear pain, onset after URI recently at home, resp even unlabored, skin pwd, URI s/s noted

## 2011-09-03 MED ORDER — ONDANSETRON 8 MG PO TBDP
8.0000 mg | ORAL_TABLET | Freq: Once | ORAL | Status: AC
  Administered 2011-09-03: 8 mg via ORAL
  Filled 2011-09-03: qty 1

## 2011-09-03 MED ORDER — ALBUTEROL SULFATE HFA 108 (90 BASE) MCG/ACT IN AERS
2.0000 | INHALATION_SPRAY | Freq: Once | RESPIRATORY_TRACT | Status: AC
  Administered 2011-09-03: 2 via RESPIRATORY_TRACT
  Filled 2011-09-03: qty 6.7

## 2011-09-03 MED ORDER — PROMETHAZINE HCL 25 MG/ML IJ SOLN
25.0000 mg | Freq: Once | INTRAMUSCULAR | Status: AC
  Administered 2011-09-03: 25 mg via INTRAMUSCULAR
  Filled 2011-09-03: qty 1

## 2011-09-03 MED ORDER — ONDANSETRON HCL 8 MG PO TABS: 8.0000 mg | ORAL_TABLET | Freq: Three times a day (TID) | ORAL | Status: AC | PRN

## 2011-09-03 MED ORDER — OFLOXACIN 0.3 % OT SOLN: 5.0000 [drp] | Freq: Two times a day (BID) | OTIC | Status: AC

## 2011-09-03 MED ORDER — IBUPROFEN 800 MG PO TABS
800.0000 mg | ORAL_TABLET | Freq: Once | ORAL | Status: AC
  Administered 2011-09-03: 800 mg via ORAL
  Filled 2011-09-03: qty 1

## 2011-09-03 MED ORDER — AMOXICILLIN 500 MG PO CAPS: 500.0000 mg | ORAL_CAPSULE | Freq: Three times a day (TID) | ORAL | Status: AC

## 2011-09-03 NOTE — ED Provider Notes (Signed)
Medical screening examination/treatment/procedure(s) were performed by non-physician practitioner and as supervising physician I was immediately available for consultation/collaboration.   Dayton Bailiff, MD 09/03/11 [phone]

## 2011-09-03 NOTE — ED Provider Notes (Signed)
History     CSN: [id number]  Arrival date & time 09/02/11  2233   First MD Initiated Contact with Patient 09/02/11 2255      Chief Complaint  Patient presents with  . Otalgia    (Consider location/radiation/quality/duration/timing/severity/associated sxs/prior treatment) HPI History provided by pt.   Pt has h/o bipolar disorder and anxiety but is otherwise healthy.  Presents w/ c/o 2d bilateral ear pain, left worse than right w/ green and bloody drainage this afternoon.  Associated w/ nasal congestion, severe sore throat, diffuse chest tightness, chills, body aches, nausea and photophobia.  Has chronic epigastric pain that occurs with palpation only and has been evaluated by her PCP, but otherwise no abd pain nor diarrhea.  Denies SOB.  Has been taking 400mg  ibuprofen w/out relief.  No known sick contacts.  Has h/o L OM w/ TM rupture approx 1 year ago.    Past Medical History  Diagnosis Date  . Seasonal allergies   . Bipolar disorder   . Myalgia   . Deep vein blood clot of left lower extremity     History reviewed. No pertinent past surgical history.  No family history on file.  History  Substance Use Topics  . Smoking status: Never Smoker   . Smokeless tobacco: Not on file  . Alcohol Use: Yes     occasionally    OB History    Grav Para Term Preterm Abortions TAB SAB Ect Mult Living                  Review of Systems  All other systems reviewed and are negative.    Allergies  Dust mite extract and Cortisone  Home Medications   Current Outpatient Rx  Name Route Sig Dispense Refill  . ARIPIPRAZOLE 30 MG PO TABS Oral Take 30 mg by mouth every evening.      Marland Kitchen EPINEPHRINE 0.3 MG/0.3ML IJ DEVI Intramuscular Inject 0.3 mg into the muscle as needed. Allergic reactions     . HYDROXYZINE HCL 50 MG PO TABS Oral Take 100 mg by mouth daily as needed. For itching    . OXCARBAZEPINE 600 MG PO TABS Oral Take 1,200 mg by mouth at bedtime.       BP 135/71  Pulse 97   Temp(Src) 98 F (36.7 C) (Oral)  Resp 16  Wt 130 lb (58.968 kg)  SpO2 99%  LMP 08/29/2011  Physical Exam  Nursing note and vitals reviewed. Constitutional: She is oriented to person, place, and time. She appears well-developed and well-nourished. No distress.       Tearful and anxious appearing  HENT:  Head: No trismus in the jaw.  Right Ear: Tympanic membrane, external ear and ear canal normal.  Left Ear: Tympanic membrane, external ear and ear canal normal.  Mouth/Throat: Uvula is midline and mucous membranes are normal. No oropharyngeal exudate, posterior oropharyngeal edema or posterior oropharyngeal erythema.       Left TM bulging.  Canal w/out edema or drainage.    Eyes:       Normal appearance  Neck: Normal range of motion. Neck supple.  Cardiovascular: Normal rate and regular rhythm.   Pulmonary/Chest: Effort normal and breath sounds normal. She has no wheezes.       coughing  Abdominal: Soft. Bowel sounds are normal. She exhibits no distension.       Epigastric ttp  Musculoskeletal: Normal range of motion.  Lymphadenopathy:    She has no cervical adenopathy.  Neurological: She is alert  and oriented to person, place, and time.  Skin: Skin is warm and dry. No rash noted.  Psychiatric: She has a normal mood and affect. Her behavior is normal.    ED Course  Procedures (including critical care time)  Labs Reviewed - No data to display No results found.   1. Viral syndrome       MDM  Healthy 25yo F presents w/ severe left ear pain w/ report of green/bloody drainage.  Based on history and exam, likely has a viral URI, but because of c/o otorrhea and h/o OM w/ ruptured TM, will treat for bacterial ear infection.  Doubt pneumonia based on duration of symptoms, lack of fever, HR/RR and nml breath sounds.  Pt received an albuterol inhaler and d/c'd home w/ amoxicillin and topical ofloxacin as well a zofran.  She has vicodin and ibuprofen at home.  Referred to [facility]  for assistance with finding a new PCP.  Return precautions discussed.         Otilio Miu, [place] 09/03/11 [phone]

## 2011-09-03 NOTE — Discharge Instructions (Signed)
Take antibiotics as prescribed.  Take zofran as needed for nausea.  Use albuterol inhaler, 2 puffs every 4 hours, as needed for cough and shortness of breath.  Take ibuprofen with food up to three times a day and vicodin as needed for severe pain.  Do not drive within four hours of taking the vicodin.  Call [facility] [phone]) if you do not have a primary care doctor and would like assistance with finding one.   You should return to the ER if you develop difficulty breathing, abdominal pain, uncontrolled vomiting or worsening ear pain.

## 2011-09-10 ENCOUNTER — Emergency Department (HOSPITAL_COMMUNITY): Payer: BC Managed Care – PPO

## 2011-09-10 ENCOUNTER — Emergency Department (HOSPITAL_COMMUNITY)
Admission: EM | Admit: 2011-09-10 | Discharge: 2011-09-10 | Disposition: A | Discharge status: Home / Self Care | Payer: BC Managed Care – PPO | Attending: Emergency Medicine | Admitting: Emergency Medicine

## 2011-09-10 ENCOUNTER — Encounter (HOSPITAL_COMMUNITY): Payer: Self-pay | Admitting: Emergency Medicine

## 2011-09-10 DIAGNOSIS — J029 Acute pharyngitis, unspecified: Secondary | ICD-10-CM | POA: Insufficient documentation

## 2011-09-10 DIAGNOSIS — R05 Cough: Secondary | ICD-10-CM | POA: Insufficient documentation

## 2011-09-10 DIAGNOSIS — R059 Cough, unspecified: Secondary | ICD-10-CM | POA: Insufficient documentation

## 2011-09-10 DIAGNOSIS — J069 Acute upper respiratory infection, unspecified: Secondary | ICD-10-CM

## 2011-09-10 LAB — RAPID STREP SCREEN (MED CTR MEBANE ONLY): Streptococcus, Group A Screen (Direct): NEGATIVE

## 2011-09-10 MED ORDER — GUAIFENESIN ER 1200 MG PO TB12: 1.0000 | ORAL_TABLET | Freq: Two times a day (BID) | ORAL | Status: DC

## 2011-09-10 MED ORDER — PROMETHAZINE-DM 6.25-15 MG/5ML PO SYRP: 5.0000 mL | ORAL_SOLUTION | Freq: Four times a day (QID) | ORAL | Status: AC | PRN

## 2011-09-10 NOTE — Discharge Instructions (Signed)
Return here as needed.  Follow-up with your doctor for a recheck.  Increase your fluid intake. °

## 2011-09-10 NOTE — ED Provider Notes (Signed)
History     CSN: [id number]  Arrival date & time 09/10/11  1044   First MD Initiated Contact with Patient 09/10/11 1100      Chief Complaint  Patient presents with  . Sore Throat    "throat feels raw"  . Cough    2 week hx of productive cough    HPI Patient presents emergency Dept. withcough for the past 2 weeks.  She states that this time her throat feels raw patient states, that she has not had any fevers, nausea/vomiting, abdominal pain, chest pain, shortness of breath, weakness, body aches, dizziness, or fever she states, that she has tried over-the-counter medications without relief.  She states she was seen here10 days ago for similar symptoms. Past Medical History  Diagnosis Date  . Seasonal allergies   . Bipolar disorder   . Myalgia   . Deep vein blood clot of left lower extremity     History reviewed. No pertinent past surgical history.  History reviewed. No pertinent family history.  History  Substance Use Topics  . Smoking status: Never Smoker   . Smokeless tobacco: Not on file  . Alcohol Use: Yes     occasionally    OB History    Grav Para Term Preterm Abortions TAB SAB Ect Mult Living                  Review of Systems All other systems negative except as documented in the HPI. All pertinent positives and negatives as reviewed in the HPI.   Allergies  Dust mite extract and Cortisone  Home Medications   Current Outpatient Rx  Name Route Sig Dispense Refill  . ARIPIPRAZOLE 30 MG PO TABS Oral Take 30 mg by mouth every evening.     Marland Kitchen HYDROXYZINE HCL 50 MG PO TABS Oral Take 100 mg by mouth daily as needed. For itching    . OFLOXACIN 0.3 % OT SOLN Otic Place 5 drops in ear(s) 2 (two) times daily. 5 mL 0  . ONDANSETRON HCL 8 MG PO TABS Oral Take 1 tablet (8 mg total) by mouth every 8 (eight) hours as needed for nausea. 20 tablet 0  . OXCARBAZEPINE 600 MG PO TABS Oral Take 1,200 mg by mouth at bedtime.     . AMOXICILLIN 500 MG PO CAPS Oral Take 1  capsule (500 mg total) by mouth 3 (three) times daily. 21 capsule 0  . EPINEPHRINE 0.3 MG/0.3ML IJ DEVI Intramuscular Inject 0.3 mg into the muscle as needed. Allergic reactions       BP 141/76  Pulse 97  Temp(Src) 98.2 F (36.8 C) (Oral)  Resp 20  SpO2 100%  LMP 08/29/2011  Physical Exam Physical Examination: General appearance - alert, well appearing, and in no distress and oriented to person, place, and time Mental status - alert, oriented to person, place, and time, normal mood, behavior, speech, dress, motor activity, and thought processes Eyes - pupils equal and reactive, extraocular eye movements intact Ears - bilateral TM's and external ear canals normal Nose - normal and patent, no erythema, discharge or polyps Mouth - tongue normal, throat culture obtained and The patient has redness in her posterior oropharynx. There is no exudate and there is only minimal tonsilar swelling. Neck - supple, no significant adenopathy Lymphatics - no palpable lymphadenopathy Chest - clear to auscultation, no wheezes, rales or rhonchi, symmetric air entry, no tachypnea, retractions or cyanosis Heart - normal rate, regular rhythm, normal S1, S2, no murmurs, rubs, clicks  or gallops Skin - normal coloration and turgor, no rashes, no suspicious skin lesions noted  ED Course  Procedures (including critical care time)   Labs Reviewed  RAPID STREP SCREEN   Dg Chest 2 View  09/10/2011  *RADIOLOGY REPORT*  Clinical Data: Cough  CHEST - 2 VIEW  Comparison: 01/19/2007  Findings: Cardiomediastinal silhouette is stable.  No acute infiltrate or pleural effusion.  No pulmonary edema.  Bony thorax is stable.  IMPRESSION: No active disease.  No significant change.  Original Report Authenticated By: Natasha Mead, M.D.     The patient will be treated for viral URI with cough. The patient is advised to return here as needed. Increase her fluids. Return here as needed.   MDM  MDM Reviewed: vitals, nursing  note and previous chart Interpretation: labs and x-ray            Carlyle Dolly, PA-C 09/10/11 1230

## 2011-09-10 NOTE — ED Provider Notes (Signed)
Medical screening examination/treatment/procedure(s) were performed by non-physician practitioner and as supervising physician I was immediately available for consultation/collaboration.  Onnika Siebel, MD 09/10/11 1553 

## 2011-09-10 NOTE — ED Notes (Signed)
Pt reports 2 week hx of URI sx unresolved by amoxicillin

## 2012-03-30 ENCOUNTER — Emergency Department (INDEPENDENT_AMBULATORY_CARE_PROVIDER_SITE_OTHER)
Admission: EM | Admit: 2012-03-30 | Discharge: 2012-03-30 | Disposition: A | Discharge status: Home / Self Care | Payer: Worker's Compensation | Source: Home / Self Care | Attending: Emergency Medicine | Admitting: Emergency Medicine

## 2012-03-30 ENCOUNTER — Emergency Department (INDEPENDENT_AMBULATORY_CARE_PROVIDER_SITE_OTHER): Payer: Worker's Compensation

## 2012-03-30 ENCOUNTER — Encounter (HOSPITAL_COMMUNITY): Payer: Self-pay | Admitting: Emergency Medicine

## 2012-03-30 DIAGNOSIS — S6390XA Sprain of unspecified part of unspecified wrist and hand, initial encounter: Secondary | ICD-10-CM

## 2012-03-30 DIAGNOSIS — S63619A Unspecified sprain of unspecified finger, initial encounter: Secondary | ICD-10-CM

## 2012-03-30 NOTE — ED Provider Notes (Signed)
Chief Complaint  Patient presents with  . Finger Injury    History of Present Illness:   The patient is a 26 year old female who injured her right ring finger yesterday. She slipped on a piece of ice while at work, and bent her finger. Ever since then she's had pain over the PIP joint. It's been slightly swollen. She is unable to completely extend or completely flex. She denies any numbness or tingling.  Review of Systems:  Other than noted above, the patient denies any of the following symptoms: Systemic:  No fevers, chills, sweats, or aches.  No fatigue or tiredness. Musculoskeletal:  No joint pain, arthritis, bursitis, swelling, back pain, or neck pain. Neurological:  No muscular weakness, paresthesias, headache, or trouble with speech or coordination.  No dizziness.  PMFSH:  Past medical history, family history, social history, meds, and allergies were reviewed.  Physical Exam:   Vital signs:  BP 139/82  Pulse 72  Temp 99 F (37.2 C) (Oral)  Resp 17  SpO2 100%  LMP 03/23/2012 Gen:  Alert and oriented times 3.  In no distress. Musculoskeletal: No swelling, bruising, or deformity. There is pain to palpation of the PIP joint. Collateral ligaments are intact. Extensor and flexor mechanisms are intact. Otherwise, all joints had a full a ROM with no swelling, bruising or deformity.  No edema, pulses full. Extremities were warm and pink.  Capillary refill was brisk.  Skin:  Clear, warm and dry.  No rash. Neuro:  Alert and oriented times 3.  Muscle strength was normal.  Sensation was intact to light touch.   Radiology:  Dg Finger Ring Right  03/30/2012  *RADIOLOGY REPORT*  Clinical Data: Finger injury, PIP joint pain  RIGHT RING FINGER 2+V  Comparison: None  Findings: Osseous mineralization normal. Joint spaces preserved. No acute fracture, dislocation or bone destruction. Question minimal soft tissue swelling at the level of the PIP joint.  IMPRESSION: No acute osseous abnormalities.    Original Report Authenticated By: Ulyses Southward, M.D.    I reviewed the images independently and personally and concur with the radiologist's findings.  Course in Urgent Care Center:   She was placed in a finger splint in position of function.  Assessment:  The encounter diagnosis was Finger sprain.  Plan:   1.  The following meds were prescribed:   New Prescriptions   No medications on file   2.  The patient was instructed in symptomatic care, including rest and activity, elevation, application of ice and compression.  Appropriate handouts were given. 3.  The patient was told to return if becoming worse in any way, if no better in 3 or 4 days, and given some red flag symptoms that would indicate earlier return.   4.  The patient was told to follow up with occupational health if no better in one to 2 months.     Reuben Likes, MD 03/30/12 2115

## 2012-03-30 NOTE — ED Notes (Signed)
Reports right ring finger is broken.  Patient reports she fell and caught herself with her hand.  Patient put ice on hand this morning when she notice swelling.

## 2012-08-07 ENCOUNTER — Ambulatory Visit: Payer: BC Managed Care – PPO

## 2012-11-06 ENCOUNTER — Emergency Department (HOSPITAL_COMMUNITY): Payer: BC Managed Care – PPO

## 2012-11-06 ENCOUNTER — Emergency Department (HOSPITAL_COMMUNITY)
Admission: EM | Admit: 2012-11-06 | Discharge: 2012-11-06 | Disposition: A | Discharge status: Home / Self Care | Payer: BC Managed Care – PPO | Attending: Emergency Medicine | Admitting: Emergency Medicine

## 2012-11-06 ENCOUNTER — Encounter (HOSPITAL_COMMUNITY): Payer: Self-pay | Admitting: Emergency Medicine

## 2012-11-06 DIAGNOSIS — S93409A Sprain of unspecified ligament of unspecified ankle, initial encounter: Secondary | ICD-10-CM | POA: Insufficient documentation

## 2012-11-06 DIAGNOSIS — Y929 Unspecified place or not applicable: Secondary | ICD-10-CM | POA: Insufficient documentation

## 2012-11-06 DIAGNOSIS — R5381 Other malaise: Secondary | ICD-10-CM | POA: Insufficient documentation

## 2012-11-06 DIAGNOSIS — W108XXA Fall (on) (from) other stairs and steps, initial encounter: Secondary | ICD-10-CM | POA: Insufficient documentation

## 2012-11-06 DIAGNOSIS — S93402A Sprain of unspecified ligament of left ankle, initial encounter: Secondary | ICD-10-CM

## 2012-11-06 DIAGNOSIS — Z86718 Personal history of other venous thrombosis and embolism: Secondary | ICD-10-CM | POA: Insufficient documentation

## 2012-11-06 DIAGNOSIS — R209 Unspecified disturbances of skin sensation: Secondary | ICD-10-CM | POA: Insufficient documentation

## 2012-11-06 DIAGNOSIS — F319 Bipolar disorder, unspecified: Secondary | ICD-10-CM | POA: Insufficient documentation

## 2012-11-06 DIAGNOSIS — Y9301 Activity, walking, marching and hiking: Secondary | ICD-10-CM | POA: Insufficient documentation

## 2012-11-06 DIAGNOSIS — Z8709 Personal history of other diseases of the respiratory system: Secondary | ICD-10-CM | POA: Insufficient documentation

## 2012-11-06 DIAGNOSIS — M791 Myalgia, unspecified site: Secondary | ICD-10-CM

## 2012-11-06 DIAGNOSIS — Z79899 Other long term (current) drug therapy: Secondary | ICD-10-CM | POA: Insufficient documentation

## 2012-11-06 NOTE — ED Provider Notes (Signed)
History    CSN: [id number] Arrival date & time 11/06/12  1304  First MD Initiated Contact with Patient 11/06/12 1334     Chief Complaint  Patient presents with  . Ankle Injury   (Consider location/radiation/quality/duration/timing/severity/associated sxs/prior Treatment) The history is provided by the patient. No language interpreter was used.  [name] is a 27 y/o F with PMHx of myalgia, bipolar disorder, DVT of left lower extremity presenting to the ED after sustaining a left ankle injury - patient reported that while walking she was texting, missed a step and ended up landing on her left ankle in an inverted manner. Patient described the left ankle discomfort as a burning sensation with mild numbness, localized mainly to the lateral aspect of the left ankle - without radiation, stated that the pain is worse when she applies pressure - stated that when she fell she heard a pop - stated that she did not use anything for the discomfort. Stated that her left ankle feels weak. Denied paresthesias, dizziness, head injury, chest pain shortness of breath, difficulty breathing. PCP: Dr. Phillips Odor  Past Medical History  Diagnosis Date  . Seasonal allergies   . Bipolar disorder   . Myalgia   . Deep vein blood clot of left lower extremity    History reviewed. No pertinent past surgical history. No family history on file. History  Substance Use Topics  . Smoking status: Never Smoker   . Smokeless tobacco: Not on file  . Alcohol Use: Yes     Comment: occasionally   OB History   Grav Para Term Preterm Abortions TAB SAB Ect Mult Living                 Review of Systems  HENT: Negative for neck pain.   Respiratory: Negative for chest tightness and shortness of breath.   Cardiovascular: Negative for chest pain.  Musculoskeletal: Positive for arthralgias (left ankle pain).  Neurological: Positive for weakness and numbness. Negative for dizziness and headaches.  All other systems  reviewed and are negative.    Allergies  Dust mite extract and Cortisone  Home Medications   Current Outpatient Rx  Name  Route  Sig  Dispense  Refill  . ARIPiprazole (ABILIFY) 20 MG tablet   Oral   Take 20 mg by mouth daily.         Marland Kitchen EPINEPHrine (EPI-PEN) 0.3 mg/0.3 mL DEVI   Intramuscular   Inject 0.3 mg into the muscle as needed. Allergic reactions          . etonogestrel (IMPLANON) 68 MG IMPL implant   Subcutaneous   Inject 1 each into the skin once.         Marland Kitchen oxcarbazepine (TRILEPTAL) 600 MG tablet   Oral   Take 1,200 mg by mouth at bedtime.           BP 138/71  Pulse 74  Temp(Src) 97.9 F (36.6 C) (Oral)  Resp 18  SpO2 100%  LMP 10/02/2012 Physical Exam  Nursing note and vitals reviewed. Constitutional: She is oriented to person, place, and time. She appears well-developed and well-nourished. No distress.  HENT:  Head: Normocephalic and atraumatic.  Eyes: Conjunctivae and EOM are normal. Pupils are equal, round, and reactive to light. Right eye exhibits no discharge. Left eye exhibits no discharge.  Neck: Normal range of motion. Neck supple.  Negative nuchal rigidity Negative neck stiffness   Cardiovascular: Normal rate, regular rhythm and normal heart sounds.  Exam reveals no friction  rub.   No murmur heard. Pulses:      Radial pulses are 2+ on the right side, and 2+ on the left side.       Dorsalis pedis pulses are 2+ on the right side, and 2+ on the left side.  Pulmonary/Chest: Effort normal and breath sounds normal. No respiratory distress. She has no wheezes. She has no rales.  Musculoskeletal: She exhibits tenderness. She exhibits no edema.       Left ankle: She exhibits no swelling, no ecchymosis, no deformity and no laceration. Tenderness. Lateral malleolus and AITFL tenderness found. No medial malleolus tenderness found. Achilles tendon exhibits normal Thompson's test results.       Feet:  Negative ecchymosis, swelling, erythema,  inflammation noted Pain upon palpation to the lateral aspect of the left foot - left lateral malleolus, left ATFL Patient able to wiggle toes mildly Decreased strength to left ankle secondary to discomfort, 4+/5+, decreased dorsi-flexion secondary to discmfort  Neurological: She is alert and oriented to person, place, and time. She exhibits normal muscle tone. Coordination normal.  Patient unable to apply pressure to left foot secondary to left ankle pain. Sensation intact to lower extremities, bilaterally, with differentiation to sharp and dull touch.   Skin: Skin is warm and dry. No rash noted. She is not diaphoretic. No erythema.  Psychiatric: She has a normal mood and affect. Her behavior is normal. Thought content normal.    ED Course  Procedures (including critical care time) Labs Reviewed - No data to display Dg Ankle Complete Left  11/06/2012   *RADIOLOGY REPORT*  Clinical Data: Lateral ankle pain  LEFT ANKLE COMPLETE - 3+ VIEW  Comparison: None.  Findings:  No fracture or dislocation. Ankle mortise is preserved.  Joint spaces are preserved.  No definite ankle joint effusion.  Regional soft tissues are normal. No radiopaque foreign body.  IMPRESSION: No acute findings.   Original Report Authenticated By: Tacey Ruiz, MD   1. Ankle sprain, left, initial encounter   2. Bipolar 1 disorder   3. Myalgia     MDM  Patient presenting to the ED with left ankle pain after sustaining an injury from mis-stepping and landing on left ankle in inverted manner - reported numbness and discomfort described as burning sensation.  No neurovascular damage noted. Weakness noted to the left ankle, 4+/5+, specifically with dorsi-flexion. Sensation intact with differentiation to sharp and dull touch to left ankle. Negative ecchymosis, erythema, inflammation, swelling noted to the left ankle.  Left ankle imaging negative findings - no acute findings. Suspicion to be musculoskeletal in nature secondary to  trauma, suspicion to be possible ligamental injury, ankle sprain. Placed patient in camwalker boot with crutches. Patient stable, afebrile. Discharged patient. Patient discharged with referral to PCP and orthopedics. Discussed with patient to use crutches at all times, keep ankle elevated and propped up with pillows - discussed with patient to ice. Discussed with patient to continue to monitor symptoms and if symptoms are to worsen or change to report back to the ED- strict return instructions given.  Patient agreed to plan of care, understood, all questions answered.   Raymon Mutton, PA-C 11/06/12 1718

## 2012-11-06 NOTE — ED Notes (Addendum)
Calling for [employer] and phone is busy to get [employer]. Ortho floor called.

## 2012-11-06 NOTE — ED Notes (Signed)
States that she fell down her steps and has left ankle pain.

## 2012-11-07 NOTE — ED Provider Notes (Signed)
Medical screening examination/treatment/procedure(s) were performed by non-physician practitioner and as supervising physician I was immediately available for consultation/collaboration.   Benny Lennert, MD 11/07/12 [phone]

## 2013-04-04 ENCOUNTER — Encounter (HOSPITAL_COMMUNITY): Payer: Self-pay | Admitting: Emergency Medicine

## 2013-04-04 ENCOUNTER — Emergency Department (HOSPITAL_COMMUNITY)
Admission: EM | Admit: 2013-04-04 | Discharge: 2013-04-04 | Disposition: A | Discharge status: Home / Self Care | Payer: BC Managed Care – PPO | Attending: Emergency Medicine | Admitting: Emergency Medicine

## 2013-04-04 ENCOUNTER — Emergency Department (HOSPITAL_COMMUNITY): Payer: BC Managed Care – PPO

## 2013-04-04 DIAGNOSIS — Z79899 Other long term (current) drug therapy: Secondary | ICD-10-CM | POA: Insufficient documentation

## 2013-04-04 DIAGNOSIS — Z86718 Personal history of other venous thrombosis and embolism: Secondary | ICD-10-CM | POA: Insufficient documentation

## 2013-04-04 DIAGNOSIS — T7840XA Allergy, unspecified, initial encounter: Secondary | ICD-10-CM

## 2013-04-04 DIAGNOSIS — I10 Essential (primary) hypertension: Secondary | ICD-10-CM | POA: Insufficient documentation

## 2013-04-04 DIAGNOSIS — Z8659 Personal history of other mental and behavioral disorders: Secondary | ICD-10-CM | POA: Insufficient documentation

## 2013-04-04 DIAGNOSIS — R0789 Other chest pain: Secondary | ICD-10-CM | POA: Insufficient documentation

## 2013-04-04 DIAGNOSIS — Z8739 Personal history of other diseases of the musculoskeletal system and connective tissue: Secondary | ICD-10-CM | POA: Insufficient documentation

## 2013-04-04 DIAGNOSIS — Z87892 Personal history of anaphylaxis: Secondary | ICD-10-CM | POA: Insufficient documentation

## 2013-04-04 DIAGNOSIS — Z8709 Personal history of other diseases of the respiratory system: Secondary | ICD-10-CM | POA: Insufficient documentation

## 2013-04-04 MED ORDER — DEXAMETHASONE SODIUM PHOSPHATE 10 MG/ML IJ SOLN
10.0000 mg | Freq: Once | INTRAMUSCULAR | Status: AC
  Administered 2013-04-04: 10 mg via INTRAVENOUS
  Filled 2013-04-04: qty 1

## 2013-04-04 MED ORDER — EPINEPHRINE 0.3 MG/0.3ML IJ SOAJ: 0.3000 mg | Freq: Once | INTRAMUSCULAR | Status: DC

## 2013-04-04 MED ORDER — FAMOTIDINE IN NACL 20-0.9 MG/50ML-% IV SOLN
20.0000 mg | Freq: Once | INTRAVENOUS | Status: AC
  Administered 2013-04-04: 20 mg via INTRAVENOUS
  Filled 2013-04-04: qty 50

## 2013-04-04 MED ORDER — DIPHENHYDRAMINE HCL 50 MG/ML IJ SOLN
25.0000 mg | Freq: Once | INTRAMUSCULAR | Status: AC
  Administered 2013-04-04: 25 mg via INTRAVENOUS
  Filled 2013-04-04: qty 1

## 2013-04-04 NOTE — ED Notes (Addendum)
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 Pt was at work and stated that her chest started feeling tight, throat felt like it was closing. Pt co-workers injected Epi-pen to her L hip approx 1 hr PTA. Pt is A&O and in NAD. Pt adds that her throat does not feel like it is closing at this time, but her chest still feels tight. Pt denies SOB at this time

## 2013-04-04 NOTE — ED Provider Notes (Signed)
CSN: [id number]     Arrival date & time 04/04/13  1757 History   First MD Initiated Contact with Patient 04/04/13 1800     Chief Complaint  Patient presents with  . Allergic Reaction    HPI  Patient presents after using her epinephrine at work. She has a history of "anaphylactic shock". She describes an ICU stay prophylaxis. This was several years ago. She was admitted for this. Saw allergist. Has multiple environmental allergies including cats dust mites. No new exposures today. She was at work. Currently tightness in her chest the base of her neck. She used her EpiPen. Symptoms are better but she still feels tight in her chest. No hives. No itching. No tightness in the throat. No swelling of the tongue. No new classic exposures today with medications bites and stings foods etc.  Past Medical History  Diagnosis Date  . Seasonal allergies   . Bipolar disorder   . Myalgia   . Deep vein blood clot of left lower extremity    History reviewed. No pertinent past surgical history. History reviewed. No pertinent family history. History  Substance Use Topics  . Smoking status: Never Smoker   . Smokeless tobacco: Not on file  . Alcohol Use: Yes     Comment: occasionally   OB History   Grav Para Term Preterm Abortions TAB SAB Ect Mult Living                 Review of Systems  Constitutional: Negative for fever, chills, diaphoresis, appetite change and fatigue.  HENT: Negative for mouth sores, sore throat and trouble swallowing.   Eyes: Negative for visual disturbance.  Respiratory: Positive for chest tightness. Negative for cough, shortness of breath and wheezing.   Cardiovascular: Negative for chest pain.  Gastrointestinal: Negative for nausea, vomiting, abdominal pain, diarrhea and abdominal distention.  Endocrine: Negative for polydipsia, polyphagia and polyuria.  Genitourinary: Negative for dysuria, frequency and hematuria.  Musculoskeletal: Negative for gait problem.  Skin:  Negative for color change, pallor and rash.  Neurological: Negative for dizziness, syncope, light-headedness and headaches.  Hematological: Does not bruise/bleed easily.  Psychiatric/Behavioral: Negative for behavioral problems and confusion.    Allergies  Dust mite extract; Pollen extract; and Cortisone  Home Medications   Current Outpatient Rx  Name  Route  Sig  Dispense  Refill  . ARIPiprazole (ABILIFY) 20 MG tablet   Oral   Take 20 mg by mouth every evening.          Marland Kitchen EPINEPHrine (EPI-PEN) 0.3 mg/0.3 mL DEVI   Intramuscular   Inject 0.3 mg into the muscle as needed. Allergic reactions          . etonogestrel (NEXPLANON) 68 MG IMPL implant   Subcutaneous   Inject 1 each into the skin once.         Marland Kitchen EPINEPHrine (EPI-PEN) 0.3 mg/0.3 mL SOAJ injection   Intramuscular   Inject 0.3 mLs (0.3 mg total) into the muscle once.   1 Device   1    BP 148/87  Pulse 96  Temp(Src) 97.6 F (36.4 C) (Oral)  Resp 18  SpO2 100%  LMP 03/21/2013 Physical Exam  Constitutional: She is oriented to person, place, and time. She appears well-developed and well-nourished. No distress.  HENT:  Head: Normocephalic.  Normal appearance the tongue. Normal posterior pharynx. No uvular edema or deviation.  Eyes: Conjunctivae are normal. Pupils are equal, round, and reactive to light. No scleral icterus.  Neck: Normal range of  motion. Neck supple. No thyromegaly present.  Cardiovascular: Normal rate and regular rhythm.  Exam reveals no gallop and no friction rub.   No murmur heard. Pulmonary/Chest: Effort normal and breath sounds normal. No respiratory distress. She has no wheezes. She has no rales.  Normal pulmonary exam. No wheezing or prolongation  Abdominal: Soft. Bowel sounds are normal. She exhibits no distension. There is no tenderness. There is no rebound.  Musculoskeletal: Normal range of motion.  Neurological: She is alert and oriented to person, place, and time.  Skin: Skin is  warm and dry. No rash noted.  No complain of subjective pruritus. No urticaria.  Psychiatric: She has a normal mood and affect. Her behavior is normal.    ED Course  Procedures (including critical care time) Labs Review Labs Reviewed - No data to display Imaging Review Dg Chest 2 View  04/04/2013   CLINICAL DATA:  Shortness of breath  EXAM: CHEST  2 VIEW  COMPARISON:  September 10, 2011  FINDINGS: The heart size and mediastinal contours are within normal limits. Both lungs are clear. The visualized skeletal structures are stable.  IMPRESSION: No active cardiopulmonary disease.   Electronically Signed   By: Sherian Rein M.D.   On: 04/04/2013 19:27    EKG Interpretation   None       MDM   1. Allergic reaction, initial encounter    The patient remained asystematic periods her care over to half hours. No urticaria no itching no throat symptoms. Normal EKG. She appropriate for outpatient treatment. Given her refill her EpiPen.    Roney Marion, MD 04/04/13 2014

## 2014-02-25 ENCOUNTER — Emergency Department (HOSPITAL_COMMUNITY)
Admission: EM | Admit: 2014-02-25 | Discharge: 2014-02-25 | Disposition: A | Discharge status: Home / Self Care | Payer: BC Managed Care – PPO | Attending: Emergency Medicine | Admitting: Emergency Medicine

## 2014-02-25 ENCOUNTER — Encounter (HOSPITAL_COMMUNITY): Payer: Self-pay | Admitting: Emergency Medicine

## 2014-02-25 DIAGNOSIS — T7849XA Other allergy, initial encounter: Secondary | ICD-10-CM | POA: Diagnosis not present

## 2014-02-25 DIAGNOSIS — F319 Bipolar disorder, unspecified: Secondary | ICD-10-CM | POA: Insufficient documentation

## 2014-02-25 DIAGNOSIS — Z86718 Personal history of other venous thrombosis and embolism: Secondary | ICD-10-CM | POA: Diagnosis not present

## 2014-02-25 DIAGNOSIS — R000 Tachycardia, unspecified: Secondary | ICD-10-CM | POA: Diagnosis not present

## 2014-02-25 DIAGNOSIS — L299 Pruritus, unspecified: Secondary | ICD-10-CM | POA: Insufficient documentation

## 2014-02-25 DIAGNOSIS — T7840XA Allergy, unspecified, initial encounter: Secondary | ICD-10-CM

## 2014-02-25 DIAGNOSIS — R0602 Shortness of breath: Secondary | ICD-10-CM | POA: Diagnosis present

## 2014-02-25 DIAGNOSIS — Z79899 Other long term (current) drug therapy: Secondary | ICD-10-CM | POA: Insufficient documentation

## 2014-02-25 MED ORDER — DIPHENHYDRAMINE HCL 50 MG/ML IJ SOLN
25.0000 mg | Freq: Once | INTRAMUSCULAR | Status: AC
  Administered 2014-02-25: 25 mg via INTRAVENOUS
  Filled 2014-02-25: qty 1

## 2014-02-25 MED ORDER — SODIUM CHLORIDE 0.9 % IV BOLUS (SEPSIS)
1000.0000 mL | Freq: Once | INTRAVENOUS | Status: AC
  Administered 2014-02-25: 1000 mL via INTRAVENOUS

## 2014-02-25 MED ORDER — METHYLPREDNISOLONE SODIUM SUCC 125 MG IJ SOLR
125.0000 mg | Freq: Once | INTRAMUSCULAR | Status: AC
  Administered 2014-02-25: 125 mg via INTRAVENOUS
  Filled 2014-02-25: qty 2

## 2014-02-25 MED ORDER — FAMOTIDINE IN NACL 20-0.9 MG/50ML-% IV SOLN
20.0000 mg | INTRAVENOUS | Status: AC
  Administered 2014-02-25: 20 mg via INTRAVENOUS
  Filled 2014-02-25: qty 50

## 2014-02-25 NOTE — ED Notes (Signed)
Patient reports she feels like her throat is tight. No signs of distress or throat swelling.

## 2014-02-25 NOTE — ED Notes (Signed)
Pt took a 25mg  Benadryl at 14:00 yesterday with some relief. Then, pt took another 25mg  Benadryl at 23:00 with no relief.

## 2014-02-25 NOTE — Discharge Instructions (Signed)
Follow up with your doctor as needed. Return to the ED with worsening or concerning symptoms.  °

## 2014-02-25 NOTE — ED Notes (Signed)
Bed: WA14 Expected date:  Expected time:  Means of arrival:  Comments: EMS 

## 2014-02-25 NOTE — ED Provider Notes (Signed)
CSN: [id number]     Arrival date & time 02/25/14  0101 History   First MD Initiated Contact with Patient 02/25/14 0107     Chief Complaint  Patient presents with  . Allergic Reaction     (Consider location/radiation/quality/duration/timing/severity/associated sxs/prior Treatment) Patient is a 28 y.o. female presenting with allergic reaction. The history is provided by the patient. No language interpreter was used.  Allergic Reaction Presenting symptoms: difficulty breathing, difficulty swallowing and itching   Difficulty breathing:    Severity:  Moderate   Onset quality:  Gradual   Duration:  10 hours   Timing:  Intermittent   Progression:  Unchanged Difficulty swallowing:    Severity:  Moderate   Onset quality:  Gradual   Duration:  10 hours   Timing:  Constant Itching:    Location:  Full body   Severity:  Moderate   Onset quality:  Gradual   Duration:  10 hours   Timing:  Constant   Progression:  Unchanged Severity:  Moderate Prior allergic episodes:  Animal allergies Context: animal exposure   Context: no chemicals, no cosmetics, no food allergies, no grass, no insect bite/sting, no medications and no new detergents/soaps   Relieved by:  Nothing Worsened by:  Nothing tried Ineffective treatments:  Epinephrine   Past Medical History  Diagnosis Date  . Seasonal allergies   . Bipolar disorder   . Myalgia   . Deep vein blood clot of left lower extremity    History reviewed. No pertinent past surgical history. History reviewed. No pertinent family history. History  Substance Use Topics  . Smoking status: Never Smoker   . Smokeless tobacco: Not on file  . Alcohol Use: Yes     Comment: occasionally   OB History   Grav Para Term Preterm Abortions TAB SAB Ect Mult Living                 Review of Systems  Constitutional: Negative for fever, chills and fatigue.  HENT: Positive for trouble swallowing.   Eyes: Negative for visual disturbance.  Respiratory:  Positive for shortness of breath.   Cardiovascular: Negative for chest pain and palpitations.  Gastrointestinal: Negative for nausea, vomiting, abdominal pain and diarrhea.  Genitourinary: Negative for dysuria and difficulty urinating.  Musculoskeletal: Negative for arthralgias and neck pain.  Skin: Positive for itching. Negative for color change.  Neurological: Negative for dizziness and weakness.  Psychiatric/Behavioral: Negative for dysphoric mood.      Allergies  Dust mite extract; Pollen extract; and Cortisone  Home Medications   Prior to Admission medications   Medication Sig Start Date End Date Taking? Authorizing Provider  ARIPiprazole (ABILIFY) 20 MG tablet Take 20 mg by mouth every evening.     Historical Provider, MD  EPINEPHrine (EPI-PEN) 0.3 mg/0.3 mL DEVI Inject 0.3 mg into the muscle as needed. Allergic reactions  04/26/11   Gina JesterKathleen McManus, DO  EPINEPHrine (EPI-PEN) 0.3 mg/0.3 mL SOAJ injection Inject 0.3 mLs (0.3 mg total) into the muscle once. 04/04/13   Rolland PorterMark James, MD  etonogestrel (NEXPLANON) 68 MG IMPL implant Inject 1 each into the skin once.    Historical Provider, MD   BP 159/82  Pulse 112  Temp(Src) 97.9 F (36.6 C) (Oral)  Resp 18  SpO2 100% Physical Exam  Nursing note and vitals reviewed. Constitutional: She is oriented to person, place, and time. She appears well-developed and well-nourished. No distress.  HENT:  Head: Normocephalic and atraumatic.  Eyes: Conjunctivae and EOM are normal.  Neck:  Normal range of motion.  Cardiovascular: Regular rhythm.  Exam reveals no gallop and no friction rub.   No murmur heard. tachycardic  Pulmonary/Chest: Effort normal and breath sounds normal. She has no wheezes. She has no rales. She exhibits no tenderness.  Abdominal: Soft. She exhibits no distension. There is no tenderness. There is no rebound.  Musculoskeletal: Normal range of motion.  Neurological: She is alert and oriented to person, place, and time.  Coordination normal.  Speech is goal-oriented. Moves limbs without ataxia.   Skin: Skin is warm and dry.  No rash or urticarial lesions noted.   Psychiatric: She has a normal mood and affect. Her behavior is normal.    ED Course  Procedures (including critical care time) Labs Review Labs Reviewed - No data to display  Imaging Review No results found.   EKG Interpretation None      MDM   Final diagnoses:  Allergic reaction, initial encounter    1:12 AM Patient will have IV fluids, solumedrol, pepcid and benadryl. Patient is mildly tachycardic at this time with remaining vitals stable.   3:31 AM Patient feeling better and is able to lay flat without feeling short of breath or throat closing. Vitals stable and patient afebrile. Patient instructed to return with worsening or concerning symptoms.   [name], [place]-C 02/25/14 [phone]

## 2014-02-25 NOTE — ED Notes (Signed)
Pt reports she is ready for discharge.

## 2014-02-25 NOTE — ED Provider Notes (Signed)
Medical screening examination/treatment/procedure(s) were performed by non-physician practitioner and as supervising physician I was immediately available for consultation/collaboration.   EKG Interpretation None        Alfonzo Arca, MD 02/25/14 2243 

## 2014-02-25 NOTE — ED Notes (Signed)
Pt states she has been having allergic reactions all day. Pt states she has been having runny nose and itching all day. Pt's md states she may be allergic to cats. But pt has 2 cats. This am pt states she felt like her throat was closing up. Pt has epi pen and used epi pen. Pt called ems and pt brought in via ems. No interventions in route to ED. Pt is alert and oriented x 4.

## 2014-02-25 NOTE — ED Notes (Signed)
Pt refused wheelchair and ambulated out of facility.

## 2014-09-14 ENCOUNTER — Ambulatory Visit: Payer: Self-pay

## 2014-09-14 ENCOUNTER — Encounter: Payer: Self-pay | Admitting: Podiatry

## 2014-09-14 NOTE — Progress Notes (Signed)
This encounter was created in error - please disregard.

## 2014-09-27 ENCOUNTER — Ambulatory Visit: Payer: Self-pay | Admitting: Podiatry

## 2015-01-15 ENCOUNTER — Emergency Department (HOSPITAL_COMMUNITY)
Admission: EM | Admit: 2015-01-15 | Discharge: 2015-01-15 | Disposition: A | Discharge status: Home / Self Care | Payer: BLUE CROSS/BLUE SHIELD | Attending: Emergency Medicine | Admitting: Emergency Medicine

## 2015-01-15 ENCOUNTER — Encounter (HOSPITAL_COMMUNITY): Payer: Self-pay | Admitting: Emergency Medicine

## 2015-01-15 DIAGNOSIS — Z86718 Personal history of other venous thrombosis and embolism: Secondary | ICD-10-CM | POA: Insufficient documentation

## 2015-01-15 DIAGNOSIS — N61 Inflammatory disorders of breast: Secondary | ICD-10-CM | POA: Diagnosis not present

## 2015-01-15 DIAGNOSIS — F319 Bipolar disorder, unspecified: Secondary | ICD-10-CM | POA: Insufficient documentation

## 2015-01-15 DIAGNOSIS — R61 Generalized hyperhidrosis: Secondary | ICD-10-CM | POA: Diagnosis not present

## 2015-01-15 DIAGNOSIS — N644 Mastodynia: Secondary | ICD-10-CM | POA: Diagnosis present

## 2015-01-15 DIAGNOSIS — Z8739 Personal history of other diseases of the musculoskeletal system and connective tissue: Secondary | ICD-10-CM | POA: Diagnosis not present

## 2015-01-15 MED ORDER — CEPHALEXIN 500 MG PO CAPS: 500.0000 mg | ORAL_CAPSULE | Freq: Four times a day (QID) | ORAL | Status: DC

## 2015-01-15 NOTE — ED Provider Notes (Signed)
CSN: [id number]     Arrival date & time 01/15/15  1444 History  This chart was scribed for non-physician practitioner, Joycie Peek, PA-C working with Elwin Mocha, MD by Placido Sou, ED scribe. This patient was seen in room WTR8/WTR8 and the patient's care was started at 3:16 PM.  Chief Complaint  Patient presents with  . Breast Pain    redness and pain and itching in r/breast   The history is provided by the patient. No language interpreter was used.    HPI Comments: [name] is a 29 y.o. female who presents to the Emergency Department complaining of constant, mild, bilateral, right greater than left, breast pain with onset 2 months ago. Pt notes having seen her OB/GYN for this issue who believed that is was mastitis and was prescribed antibiotics which provided no relief of her symptoms and came to the ED for further evaluation. Pt describes her pain as "deep" and "sharp" and notes associated swelling and irritation of the affected area as well as nocturnal hyperhidrosis which began intermittently 3 weeks ago. She denies any known health issues but does note a lengthy fhx of breast cancer with her youngest known relative being diagnosed at 28 y/o. Pt denies currently lactating or having had any children. She notes currently being on birth control with her LNMP being last week. Pain unrelated to menstrual cycle.She notes an allergy to steroids. Pt denies any fever, chills, n/v/d, rashes and abd pain.   Past Medical History  Diagnosis Date  . Seasonal allergies   . Bipolar disorder   . Myalgia   . Deep vein blood clot of left lower extremity    History reviewed. No pertinent past surgical history. History reviewed. No pertinent family history. Social History  Substance Use Topics  . Smoking status: Never Smoker   . Smokeless tobacco: None  . Alcohol Use: Yes     Comment: occasionally   OB History    No data available     Review of Systems  Constitutional: Positive for  diaphoresis. Negative for fever and chills.  Gastrointestinal: Negative for nausea, vomiting, abdominal pain and diarrhea.  Musculoskeletal: Positive for myalgias.  Skin: Negative for rash.  All other systems reviewed and are negative.  Allergies  Dust mite extract; Pollen extract; and Cortisone  Home Medications   Prior to Admission medications   Medication Sig Start Date End Date Taking? Authorizing Provider  ARIPiprazole (ABILIFY) 20 MG tablet Take 20 mg by mouth every evening.     Historical Provider, MD  cephALEXin (KEFLEX) 500 MG capsule Take 1 capsule (500 mg total) by mouth 4 (four) times daily. 01/15/15   Joycie Peek, PA-C  EPINEPHrine (EPI-PEN) 0.3 mg/0.3 mL DEVI Inject 0.3 mg into the muscle as needed. Allergic reactions  04/26/11   Samuel Jester, DO  etonogestrel (NEXPLANON) 68 MG IMPL implant Inject 1 each into the skin once.    Historical Provider, MD   BP 133/73 mmHg  Pulse 78  Temp(Src) 97.7 F (36.5 C) (Oral)  Resp 16  Wt 145 lb (65.772 kg)  SpO2 100%  LMP 12/25/2014 (Exact Date) Physical Exam  Constitutional: She is oriented to person, place, and time. She appears well-developed and well-nourished.  HENT:  Head: Normocephalic and atraumatic.  Mouth/Throat: No oropharyngeal exudate.  Neck: Normal range of motion. No tracheal deviation present.  Cardiovascular: Normal rate, regular rhythm and normal heart sounds.  Exam reveals no gallop and no friction rub.   No murmur heard. Pulmonary/Chest: Effort normal  and breath sounds normal. No respiratory distress. She has no wheezes. She has no rales.    Abdominal: Soft. There is no tenderness.  Musculoskeletal: Normal range of motion.  Neurological: She is alert and oriented to person, place, and time.  Skin: Skin is warm and dry. She is not diaphoretic.  Psychiatric: She has a normal mood and affect. Her behavior is normal.  Nursing note and vitals reviewed.  ED Course  Procedures  DIAGNOSTIC  STUDIES: Oxygen Saturation is 100% on RA, normal by my interpretation.    COORDINATION OF CARE: 3:28 PM Discussed treatment plan with pt at bedside and pt agreed to plan.  Labs Review Labs Reviewed - No data to display  Imaging Review No results found. I have personally reviewed and evaluated these images and lab results as part of my medical decision-making.   EKG Interpretation None     Filed Vitals:   01/15/15 1456  BP: 133/73  Pulse: 78  Temp: 97.7 F (36.5 C)  TempSrc: Oral  Resp: 16  Weight: 145 lb (65.772 kg)  SpO2: 100%   Meds given in ED:  Medications - No data to display  Discharge Medication List as of 01/15/2015  3:37 PM    START taking these medications   Details  cephALEXin (KEFLEX) 500 MG capsule Take 1 capsule (500 mg total) by mouth 4 (four) times daily., Starting 01/15/2015, Until Discontinued, Print        MDM  Vitals stable - WNL -afebrile Pt resting comfortably in ED. PE--physical exam as above, mild skin breakdown noted to right [place]ola.No other erythema, no discharge Will treat empirically for mastitis. Low suspicion for deep abscess. Given referral to [facility] for further evaluation and management of symptoms. Patient states she will be able to follow-up with her OB this week.  I discussed all relevant lab findings and imaging results with pt and they verbalized understanding. Discussed f/u with PCP within 48 hrs and return precautions, pt very amenable to plan.  Final diagnoses:  Mastitis    I personally performed the services described in this documentation, which was scribed in my presence. The recorded information has been reviewed and is accurate.    Joycie Peek, PA-C 01/15/15 1756  Elwin Mocha, MD 01/15/15 2236

## 2015-01-15 NOTE — ED Notes (Signed)
Pt reports increased r/breast tenderness, itching, and pain over last month. Seen by gyn 1 month ago for similar symptoms. Completed antibiotics, no relief of symptoms. Pain is in the nipple area, increased to dep tissue pain

## 2015-01-15 NOTE — ED Notes (Signed)
PA at bedside.  For assessment of breast

## 2015-01-15 NOTE — Discharge Instructions (Signed)
Please take your medications as prescribed. [facility] with [facility] for further evaluation and management of your symptoms. Return to ED for new or worsening symptoms.  Mastitis Mastitis is inflammation of the breast tissue. It occurs most often in women who are breastfeeding, but it can also affect other women, and even sometimes men. CAUSES  Mastitis is usually caused by a bacterial infection. Bacteria enter the breast tissue through cuts or openings in the skin. Typically, this occurs with breastfeeding because of cracked or irritated skin. Sometimes, it can occur even when there is no opening in the skin. It can be associated with plugged milk (lactiferous) ducts. Nipple piercing can also lead to mastitis. Also, some forms of breast cancer can cause mastitis. SIGNS AND SYMPTOMS   Swelling, redness, tenderness, and pain in an area of the breast.  Swelling of the glands under the arm on the same side.  Fever. If an infection is allowed to progress, a collection of pus (abscess) may develop. DIAGNOSIS  Your health care provider can usually diagnose mastitis based on your symptoms and a physical exam. Tests may be done to help confirm the diagnosis. These may include:   Removal of pus from the breast by applying pressure to the area. This pus can be examined in the lab to determine which bacteria are present. If an abscess has developed, the fluid in the abscess can be removed with a needle. This can also be used to confirm the diagnosis and determine the bacteria present. In most cases, pus will not be present.  Blood tests to determine if your body is fighting a bacterial infection.  Mammogram or ultrasound tests to rule out other problems or diseases. TREATMENT  Antibiotic medicine is used to treat a bacterial infection. Your health care provider will determine which bacteria are most likely causing the infection and will select an appropriate antibiotic. This is sometimes changed  based on the results of tests performed to identify the bacteria, or if there is no response to the antibiotic selected. Antibiotics are usually given by mouth. You may also be given medicine for pain. Mastitis that occurs with breastfeeding will sometimes go away on its own, so your health care provider may choose to wait 24 hours after first seeing you to decide whether a prescription medicine is needed. HOME CARE INSTRUCTIONS   Only take over-the-counter or prescription medicines for pain, fever, or discomfort as directed by your health care provider.  If your health care provider prescribed an antibiotic, take the medicine as directed. Make sure you finish it even if you start to feel better.  Do not wear a tight or underwire bra. Wear a soft, supportive bra.  Increase your fluid intake, especially if you have a fever.  Women who are breastfeeding should follow these instructions:  Continue to empty the breast. Your health care provider can tell you whether this milk is safe for your infant or needs to be thrown out. You may be told to stop nursing until your health care provider thinks it is safe for your baby. Use a breast pump if you are advised to stop nursing.  Keep your nipples clean and dry.  Empty the first breast completely before going to the other breast. If your baby is not emptying your breasts completely for some reason, use a breast pump to empty your breasts.  If you go back to work, pump your breasts while at work to stay in time with your nursing schedule.  Avoid allowing  your breasts to become overly filled with milk (engorged). SEEK MEDICAL CARE IF:   You have pus-like discharge from the breast.  Your symptoms do not improve with the treatment prescribed by your health care provider within 2 days. SEEK IMMEDIATE MEDICAL CARE IF:   Your pain and swelling are getting worse.  You have pain that is not controlled with medicine.  You have a red line extending from  the breast toward your armpit.  You have a fever or persistent symptoms for more than 2-3 days.  You have a fever and your symptoms suddenly get worse. Document Released: 04/30/2005 Document Revised: 05/05/2013 Document Reviewed: 11/28/2012 [facility] Patient Information 2015 [place], [place]. This information is not intended to replace advice given to you by your health care provider. Make sure you discuss any questions you have with your health care provider.

## 2015-01-15 NOTE — ED Notes (Signed)
Red lines noted on outer aspect of areola ,r/breast

## 2016-06-03 ENCOUNTER — Emergency Department (HOSPITAL_COMMUNITY)
Admission: EM | Admit: 2016-06-03 | Discharge: 2016-06-03 | Disposition: A | Discharge status: Home / Self Care | Payer: BLUE CROSS/BLUE SHIELD | Attending: Emergency Medicine | Admitting: Emergency Medicine

## 2016-06-03 ENCOUNTER — Emergency Department (HOSPITAL_COMMUNITY): Payer: BLUE CROSS/BLUE SHIELD

## 2016-06-03 ENCOUNTER — Encounter (HOSPITAL_COMMUNITY): Payer: Self-pay | Admitting: Emergency Medicine

## 2016-06-03 DIAGNOSIS — K429 Umbilical hernia without obstruction or gangrene: Secondary | ICD-10-CM

## 2016-06-03 DIAGNOSIS — R1033 Periumbilical pain: Secondary | ICD-10-CM | POA: Diagnosis present

## 2016-06-03 LAB — COMPREHENSIVE METABOLIC PANEL
ALT: 24 U/L (ref 14–54)
AST: 26 U/L (ref 15–41)
Albumin: 4.6 g/dL (ref 3.5–5.0)
Alkaline Phosphatase: 40 U/L (ref 38–126)
Anion gap: 8 (ref 5–15)
BUN: 17 mg/dL (ref 6–20)
CALCIUM: 9.3 mg/dL (ref 8.9–10.3)
CHLORIDE: 101 mmol/L (ref 101–111)
CO2: 28 mmol/L (ref 22–32)
CREATININE: 0.87 mg/dL (ref 0.44–1.00)
Glucose, Bld: 115 mg/dL — ABNORMAL HIGH (ref 65–99)
Potassium: 3.7 mmol/L (ref 3.5–5.1)
Sodium: 137 mmol/L (ref 135–145)
Total Bilirubin: 0.6 mg/dL (ref 0.3–1.2)
Total Protein: 8.3 g/dL — ABNORMAL HIGH (ref 6.5–8.1)

## 2016-06-03 LAB — URINALYSIS, ROUTINE W REFLEX MICROSCOPIC
Bilirubin Urine: NEGATIVE
Glucose, UA: NEGATIVE mg/dL
Hgb urine dipstick: NEGATIVE
Ketones, ur: 5 mg/dL — AB
LEUKOCYTES UA: NEGATIVE
NITRITE: NEGATIVE
PROTEIN: NEGATIVE mg/dL
Specific Gravity, Urine: 1.032 — ABNORMAL HIGH (ref 1.005–1.030)
pH: 5 (ref 5.0–8.0)

## 2016-06-03 LAB — LIPASE, BLOOD: LIPASE: 30 U/L (ref 11–51)

## 2016-06-03 LAB — CBC
HCT: 44.2 % (ref 36.0–46.0)
Hemoglobin: 15 g/dL (ref 12.0–15.0)
MCH: 33.1 pg (ref 26.0–34.0)
MCHC: 33.9 g/dL (ref 30.0–36.0)
MCV: 97.6 fL (ref 78.0–100.0)
PLATELETS: 239 10*3/uL (ref 150–400)
RBC: 4.53 MIL/uL (ref 3.87–5.11)
RDW: 11.9 % (ref 11.5–15.5)
WBC: 8.6 10*3/uL (ref 4.0–10.5)

## 2016-06-03 LAB — I-STAT BETA HCG BLOOD, ED (MC, WL, AP ONLY)

## 2016-06-03 MED ORDER — IOPAMIDOL (ISOVUE-300) INJECTION 61%
INTRAVENOUS | Status: AC
  Administered 2016-06-03: 100 mL via INTRAVENOUS
  Filled 2016-06-03: qty 100

## 2016-06-03 NOTE — ED Triage Notes (Signed)
Patient states that she has abd pain at umbilicus and hardening. Patient has had diarrhea and nausea. Patient went to urgent care and was told to come to ED for possible hernia.

## 2016-06-03 NOTE — Discharge Instructions (Signed)
Return if any problems.  See your Physiain for recheck if pain persist

## 2016-06-03 NOTE — ED Notes (Signed)
Patient transported to CT 

## 2016-06-03 NOTE — ED Provider Notes (Signed)
[facility]EMER[facility]ENCY DEPT Provider Note   CSN: [id number] Arrival date & time: 06/03/16  1037     History   Chief Complaint Chief Complaint  Patient presents with  . Abdominal Pain    HPI [name] is a 31 y.o. female.  The history is provided by the patient. No language interpreter was used.  Abdominal Pain   This is a new problem. The problem occurs constantly. The problem has been gradually worsening. The pain is located in the periumbilical region. The pain is moderate. Associated symptoms include anorexia. Nothing aggravates the symptoms. Nothing relieves the symptoms. Past workup does not include GI consult.   Pt reports urgent care was concerned that she has a hernia.  Past Medical History:  Diagnosis Date  . Bipolar disorder (HCC)   . Deep vein blood clot of left lower extremity (HCC)   . Myalgia   . Seasonal allergies     There are no active problems to display for this patient.   History reviewed. No pertinent surgical history.  OB History    No data available       Home Medications    Prior to Admission medications   Medication Sig Start Date End Date Taking? Authorizing Provider  ARIPiprazole (ABILIFY) 20 MG tablet Take 20 mg by mouth every evening.    Yes Historical Provider, MD  BLISOVI FE 1.5/30 1.5-30 MG-MCG tablet Take 1 tablet by mouth daily. 05/21/16  Yes Historical Provider, MD  fluticasone (FLONASE) 50 MCG/ACT nasal spray Place 2 sprays into the nose at bedtime. 05/21/16  Yes Historical Provider, MD  hydrOXYzine (ATARAX/VISTARIL) 25 MG tablet Take 25 mg by mouth as needed for itching. 05/15/16  Yes Historical Provider, MD  Multiple Vitamins-Minerals (WOMENS DAILY FORMULA PO) Take 1 tablet by mouth daily.   Yes Historical Provider, MD  Oxcarbazepine (TRILEPTAL) 300 MG tablet Take 300 mg by mouth at bedtime. 06/01/16  Yes Historical Provider, MD  cephALEXin (KEFLEX) 500 MG capsule Take 1 capsule (500 mg total) by mouth 4 (four) times daily. Patient not  taking: Reported on 06/03/2016 01/15/15   Joycie Peek, PA-C  EPINEPHrine (EPI-PEN) 0.3 mg/0.3 mL DEVI Inject 0.3 mg into the muscle as needed. Allergic reactions  04/26/11   Samuel Jester, DO  etonogestrel (NEXPLANON) 68 MG IMPL implant Inject 1 each into the skin once.    Historical Provider, MD    Family History No family history on file.  Social History Social History  Substance Use Topics  . Smoking status: Never Smoker  . Smokeless tobacco: Never Used  . Alcohol use Yes     Comment: occasionally     Allergies   Dust mite extract; Pollen extract; and Amoxicillin-pot clavulanate   Review of Systems Review of Systems  Gastrointestinal: Positive for abdominal pain and anorexia.  All other systems reviewed and are negative.    Physical Exam Updated Vital Signs BP 136/76   Pulse 87   Temp 97.8 F (36.6 C) (Oral)   Resp 16   Ht 5\' 4"  (1.626 m)   Wt 67.6 kg   SpO2 100%   BMI 25.58 kg/m   Physical Exam  Constitutional: She appears well-developed and well-nourished.  HENT:  Head: Normocephalic and atraumatic.  Eyes: Pupils are equal, round, and reactive to light.  Neck: Normal range of motion.  Cardiovascular: Normal rate.   Pulmonary/Chest: Effort normal.  Abdominal: Soft. There is tenderness.  Tender umbilicus, swollen area   Musculoskeletal: Normal range of motion.  Skin: Skin is  warm.  Psychiatric: She has a normal mood and affect.  Nursing note and vitals reviewed.    ED Treatments / Results  Labs (all labs ordered are listed, but only abnormal results are displayed) Labs Reviewed  COMPREHENSIVE METABOLIC PANEL - Abnormal; Notable for the following:       Result Value   Glucose, Bld 115 (*)    Total Protein 8.3 (*)    All other components within normal limits  URINALYSIS, ROUTINE W REFLEX MICROSCOPIC - Abnormal; Notable for the following:    APPearance HAZY (*)    Specific Gravity, Urine 1.032 (*)    Ketones, ur 5 (*)    All other  components within normal limits  LIPASE, BLOOD  CBC  I-STAT BETA HCG BLOOD, ED ([facility], [facility], [facility] ONLY)    EKG  EKG Interpretation None       Radiology Ct Abdomen Pelvis W Contrast  Result Date: 06/03/2016 CLINICAL DATA:  Abdominal pain at the umbilicus with hardening. Nausea and diarrhea. EXAM: CT ABDOMEN AND PELVIS WITH CONTRAST TECHNIQUE: Multidetector CT imaging of the abdomen and pelvis was performed using the standard protocol following bolus administration of intravenous contrast. CONTRAST:  1 ISOVUE-300 IOPAMIDOL (ISOVUE-300) INJECTION 61% COMPARISON:  None. FINDINGS: Lower chest: No acute abnormality. Hepatobiliary: No focal liver abnormality is seen. No gallstones, gallbladder wall thickening, or biliary dilatation. Pancreas: Unremarkable. No pancreatic ductal dilatation or surrounding inflammatory changes. Spleen: Normal in size without focal abnormality. Adrenals/Urinary Tract: Adrenal glands are unremarkable. Kidneys are normal, without renal calculi, focal lesion, or hydronephrosis. Bladder is unremarkable. Stomach/Bowel: The distal esophagus and stomach are normal in appearance. The small bowel is normal as well. The colon and appendix are normal. Vascular/Lymphatic: No significant vascular findings are present. No enlarged abdominal or pelvic lymph nodes. Reproductive: The cervix is mildly prominent with decreased enhancement. However, I suspect this is within normal limits given provided history. There can be a wide range of normal appearance of the uterus and cervix on CT imaging. A prominent follicle is seen in the left ovary. The ovaries are otherwise normal. Other: There is fat along the superior aspect of the umbilicus. There appears to be a small adjacent ventral wall defect resulting in herniated fat as seen on series 2, image 42. The findings are most consistent with a fat containing periumbilical hernia. However, the suspected gap in the anterior abdominal wall resulting in the  hernia is very small and subtle. The herniated fat is not edematous. No definitive associated inflammation. No abnormal fluid collection in this region. No free air or free fluid. No herniated bowel identified. Musculoskeletal: No acute or significant osseous findings. IMPRESSION: 1. There is a small collection of fat just superior to the umbilicus. There appears to be a subtle ventral wall defect resulting in herniated fat. The herniated fat measures 10 x 7 mm. This may not be manually reducible given the small connection between the fat and the intra-abdominal cavity. 2. No other acute abnormality. Electronically Signed   By: Gerome Samavid  Williams III M.D   On: 06/03/2016 15:35    Procedures Procedures (including critical care time)  Medications Ordered in ED Medications  iopamidol (ISOVUE-300) 61 % injection (100 mLs Intravenous Contrast Given 06/03/16 1423)     Initial Impression / Assessment and Plan / ED Course  I have reviewed the triage vital signs and the nursing notes.  Pertinent labs & imaging results that were available during my care of the patient were reviewed by me and considered in  my medical decision making (see chart for details).       Final Clinical Impressions(s) / ED Diagnoses   Final diagnoses:  Umbilical hernia without obstruction and without gangrene    New Prescriptions New Prescriptions   No medications on file     Elson Areas, PA-C 06/03/16 1614    Canary Brim Tegeler, MD 06/04/16 1014

## 2016-06-08 ENCOUNTER — Encounter (HOSPITAL_COMMUNITY): Payer: Self-pay | Admitting: Emergency Medicine

## 2016-06-08 ENCOUNTER — Emergency Department (HOSPITAL_COMMUNITY)
Admission: EM | Admit: 2016-06-08 | Discharge: 2016-06-08 | Disposition: A | Discharge status: Home / Self Care | Payer: BLUE CROSS/BLUE SHIELD | Attending: Emergency Medicine | Admitting: Emergency Medicine

## 2016-06-08 DIAGNOSIS — R1033 Periumbilical pain: Secondary | ICD-10-CM | POA: Diagnosis present

## 2016-06-08 DIAGNOSIS — Z79899 Other long term (current) drug therapy: Secondary | ICD-10-CM | POA: Diagnosis not present

## 2016-06-08 DIAGNOSIS — K429 Umbilical hernia without obstruction or gangrene: Secondary | ICD-10-CM | POA: Insufficient documentation

## 2016-06-08 MED ORDER — METHOCARBAMOL 750 MG PO TABS: 750.0000 mg | ORAL_TABLET | Freq: Four times a day (QID) | ORAL | 0 refills | Status: DC

## 2016-06-08 MED ORDER — OXYCODONE-ACETAMINOPHEN 5-325 MG PO TABS: 1.0000 | ORAL_TABLET | ORAL | 0 refills | Status: DC | PRN

## 2016-06-08 MED ORDER — OXYCODONE-ACETAMINOPHEN 5-325 MG PO TABS
1.0000 | ORAL_TABLET | ORAL | Status: DC | PRN
  Administered 2016-06-08: 1 via ORAL
  Filled 2016-06-08: qty 1

## 2016-06-08 MED ORDER — DOCUSATE SODIUM 100 MG PO CAPS: 100.0000 mg | ORAL_CAPSULE | Freq: Two times a day (BID) | ORAL | 0 refills | Status: DC

## 2016-06-08 NOTE — ED Notes (Signed)
Patient d/c'd self care.  F/U and medications reviewed.  Patient verbalized understanding. 

## 2016-06-08 NOTE — ED Triage Notes (Signed)
Patient states that she picked up a dog at work and ever since she had increased abd pain, vomiting and painful urination and BM.  Patient saw surgical MD on Monday and was told to come here today for evaluation.

## 2016-06-08 NOTE — ED Provider Notes (Signed)
[facility] Provider Note   CSN: [id number] Arrival date & time: 06/08/16  1236     History   Chief Complaint Chief Complaint  Patient presents with  . hernia pain  . Emesis    HPI [name] is a 31 y.o. female.  31 year old female resents with periumbilical abdominal pain that began after she lifted a heavy object. Patient had severe pain which then resulted in nonbilious emesis. States that she was given Percocet here with discomfort symptoms. Denies any fever or chills. No abdominal distention. No diarrhea. Pain is characterized as sharp and worse with pressing around her bellybutton. Symptoms better with remaining still.      Past Medical History:  Diagnosis Date  . Bipolar disorder (HCC)   . Deep vein blood clot of left lower extremity (HCC)   . Myalgia   . Seasonal allergies     There are no active problems to display for this patient.   History reviewed. No pertinent surgical history.  OB History    No data available       Home Medications    Prior to Admission medications   Medication Sig Start Date End Date Taking? Authorizing Provider  ARIPiprazole (ABILIFY) 20 MG tablet Take 20 mg by mouth at bedtime.    Yes Historical Provider, MD  BLISOVI FE 1.5/30 1.5-30 MG-MCG tablet Take 1 tablet by mouth daily. 05/21/16  Yes Historical Provider, MD  EPINEPHrine (EPI-PEN) 0.3 mg/0.3 mL DEVI Inject 0.3 mg into the muscle as needed. Allergic reactions  04/26/11  Yes Samuel JesterKathleen McManus, DO  fluticasone [facility](FLONASE) 50 MCG/ACT nasal spray Place 2 sprays into the nose at bedtime. 05/21/16  Yes Historical Provider, MD  ibuprofen (ADVIL,MOTRIN) 200 MG tablet Take 400 mg by mouth every 6 (six) hours as needed for fever, headache, mild pain, moderate pain or cramping.   Yes Historical Provider, MD  Multiple Vitamins-Minerals (WOMENS DAILY FORMULA PO) Take 1 tablet by mouth daily.   Yes Historical Provider, MD  Oxcarbazepine (TRILEPTAL) 300 MG tablet Take 300 mg by mouth  at bedtime. 06/01/16  Yes Historical Provider, MD    Family History No family history on file.  Social History Social History  Substance Use Topics  . Smoking status: Never Smoker  . Smokeless tobacco: Never Used  . Alcohol use Yes     Comment: occasionally     Allergies   Dust mite extract; Pollen extract; and Amoxicillin-pot clavulanate   Review of Systems Review of Systems  All other systems reviewed and are negative.    Physical Exam Updated Vital Signs BP 157/92   Pulse 66   Temp 97.8 F (36.6 C) (Oral)   Resp 18   SpO2 100%   Physical Exam  Constitutional: She is oriented to person, place, and time. She appears well-developed and well-nourished.  Non-toxic appearance. No distress.  HENT:  Head: Normocephalic and atraumatic.  Eyes: Conjunctivae, EOM and lids are normal. Pupils are equal, round, and reactive to light.  Neck: Normal range of motion. Neck supple. No tracheal deviation present. No thyroid mass present.  Cardiovascular: Normal rate, regular rhythm and normal heart sounds.  Exam reveals no gallop.   No murmur heard. Pulmonary/Chest: Effort normal and breath sounds normal. No stridor. No respiratory distress. She has no decreased breath sounds. She has no wheezes. She has no rhonchi. She has no rales.  Abdominal: Soft. Normal appearance and bowel sounds are normal. She exhibits no distension. There is no tenderness. There is no rebound and  no CVA tenderness.    No swelling or edema noted. No abdominal distention.  Musculoskeletal: Normal range of motion. She exhibits no edema or tenderness.  Neurological: She is alert and oriented to person, place, and time. She has normal strength. No cranial nerve deficit or sensory deficit. GCS eye subscore is 4. GCS verbal subscore is 5. GCS motor subscore is 6.  Skin: Skin is warm and dry. No abrasion and no rash noted.  Psychiatric: She has a normal mood and affect. Her speech is normal and behavior is normal.    Nursing note and vitals reviewed.    ED Treatments / Results  Labs (all labs ordered are listed, but only abnormal results are displayed) Labs Reviewed - No data to display  EKG  EKG Interpretation None       Radiology No results found.  Procedures Procedures (including critical care time)  Medications Ordered in ED Medications  oxyCODONE-acetaminophen (PERCOCET/ROXICET) 5-325 MG per tablet 1 tablet (1 tablet Oral Given 06/08/16 1334)     Initial Impression / Assessment and Plan / ED Course  I have reviewed the triage vital signs and the nursing notes.  Pertinent labs & imaging results that were available during my care of the patient were reviewed by me and considered in my medical decision making (see chart for details).     No evidence of obstruction .pt seen here  2 days ago for same and has gen surg f/u--will tx with meds, return precautions  given  Final Clinical Impressions(s) / ED Diagnoses   Final diagnoses:  None    New Prescriptions New Prescriptions   No medications on file     Lorre Nick, MD 06/08/16 1639

## 2016-09-09 ENCOUNTER — Emergency Department (HOSPITAL_COMMUNITY)
Admission: EM | Admit: 2016-09-09 | Discharge: 2016-09-09 | Disposition: A | Discharge status: Home / Self Care | Payer: BLUE CROSS/BLUE SHIELD | Attending: Emergency Medicine | Admitting: Emergency Medicine

## 2016-09-09 ENCOUNTER — Encounter (HOSPITAL_COMMUNITY): Payer: Self-pay | Admitting: Nurse Practitioner

## 2016-09-09 DIAGNOSIS — R6884 Jaw pain: Secondary | ICD-10-CM | POA: Diagnosis present

## 2016-09-09 MED ORDER — IBUPROFEN 200 MG PO TABS: 400.0000 mg | ORAL_TABLET | Freq: Four times a day (QID) | ORAL | 0 refills | Status: DC | PRN

## 2016-09-09 MED ORDER — METHOCARBAMOL 750 MG PO TABS: 750.0000 mg | ORAL_TABLET | Freq: Four times a day (QID) | ORAL | 0 refills | Status: DC

## 2016-09-09 NOTE — ED Provider Notes (Signed)
[facility] DEPT Provider Note   CSN: [id number] Arrival date & time: 09/09/16  1548     History   Chief Complaint Chief Complaint  Patient presents with  . Jaw Pain    HPI Lenette Chanda Busing is a 31 y.o. female.  HPI   31 year old female with history of bipolar, prior DVT presenting complaining of jaw discomfort. Patient report progressive worsening jaw pain ongoing for the past 6 months. She complaining of a soreness sensation primarily at the TMJ portion of her jaw bilaterally more noticeable when she closes her jaw with associated pain and weakness. It does hurt to talk chewing but she denies any specific dental pain, ear pain, ringing in her ear, or headache. She denies any neck pain. No associated fever or chills. No recent injury. No runny nose sneezing coughing or sore throat. She denies any change in any of her medication. No history of jaw dislocation.  Past Medical History:  Diagnosis Date  . Bipolar disorder (HCC)   . Deep vein blood clot of left lower extremity (HCC)   . Myalgia   . Seasonal allergies     There are no active problems to display for this patient.   History reviewed. No pertinent surgical history.  OB History    No data available       Home Medications    Prior to Admission medications   Medication Sig Start Date End Date Taking? Authorizing Provider  ARIPiprazole (ABILIFY) 20 MG tablet Take 20 mg by mouth at bedtime.     Historical Provider, MD  BLISOVI FE 1.5/30 1.5-30 MG-MCG tablet Take 1 tablet by mouth daily. 05/21/16   Historical Provider, MD  docusate sodium (COLACE) 100 MG capsule Take 1 capsule (100 mg total) by mouth 2 (two) times daily. 06/08/16   Lorre Nick, MD  EPINEPHrine (EPI-PEN) 0.3 mg/0.3 mL DEVI Inject 0.3 mg into the muscle as needed. Allergic reactions  04/26/11   Samuel Jester, DO  fluticasone [facility]) 50 MCG/ACT nasal spray Place 2 sprays into the nose at bedtime. 05/21/16   Historical Provider, MD  ibuprofen  (ADVIL,MOTRIN) 200 MG tablet Take 400 mg by mouth every 6 (six) hours as needed for fever, headache, mild pain, moderate pain or cramping.    Historical Provider, MD  methocarbamol (ROBAXIN-750) 750 MG tablet Take 1 tablet (750 mg total) by mouth 4 (four) times daily. 06/08/16   Lorre Nick, MD  Multiple Vitamins-Minerals (WOMENS DAILY FORMULA PO) Take 1 tablet by mouth daily.    Historical Provider, MD  Oxcarbazepine (TRILEPTAL) 300 MG tablet Take 300 mg by mouth at bedtime. 06/01/16   Historical Provider, MD  oxyCODONE-acetaminophen (PERCOCET/ROXICET) 5-325 MG tablet Take 1-2 tablets by mouth every 4 (four) hours as needed for severe pain. 06/08/16   Lorre Nick, MD    Family History History reviewed. No pertinent family history.  Social History Social History  Substance Use Topics  . Smoking status: Never Smoker  . Smokeless tobacco: Never Used  . Alcohol use Yes     Comment: occasionally     Allergies   Dust mite extract; Pollen extract; and Amoxicillin-pot clavulanate   Review of Systems Review of Systems  Constitutional: Negative for fever.  HENT: Negative for dental problem and sore throat.   Neurological: Negative for numbness and headaches.     Physical Exam Updated Vital Signs BP (!) 148/97 (BP Location: Left Arm)   Pulse 64   Resp 18   Wt 63.5 kg   SpO2 100%  BMI 24.03 kg/m   Physical Exam  Constitutional: She appears well-developed and well-nourished. No distress.  HENT:  Head: Atraumatic.  Tenderness to bilateral jaw at the TMJ without any malocclusion. Decrease jaw movement but no trismus. Uvula is midline no tonsillar enlargement or exudates, no dental decay or dental pain appreciated. Normal floor of mouth.  Eyes: Conjunctivae are normal.  Neck: Normal range of motion. Neck supple.  Trachea midline  Cardiovascular: Normal rate and regular rhythm.   Pulmonary/Chest: Effort normal and breath sounds normal. No stridor.  Neurological: She is alert.    Skin: No rash noted.  Psychiatric: She has a normal mood and affect.  Nursing note and vitals reviewed.    ED Treatments / Results  Labs (all labs ordered are listed, but only abnormal results are displayed) Labs Reviewed - No data to display  EKG  EKG Interpretation None       Radiology No results found.  Procedures Procedures (including critical care time)  Medications Ordered in ED Medications - No data to display   Initial Impression / Assessment and Plan / ED Course  I have reviewed the triage vital signs and the nursing notes.  Pertinent labs & imaging results that were available during my care of the patient were reviewed by me and considered in my medical decision making (see chart for details).     BP (!) 148/97 (BP Location: Left Arm)   Pulse 64   Resp 18   Wt 63.5 kg   SpO2 100%   BMI 24.03 kg/m    Final Clinical Impressions(s) / ED Diagnoses   Final diagnoses:  Jaw pain    New Prescriptions Current Discharge Medication List     4:43 PM Pt here with jaw pain at the TMJ region suggestive of TMJ syndrome. Doubt jaw dislocation.  Doubt dental pain.  Will prescribe NSAIDs and Muscle relaxant.  Pt to f/u with ENT as needed for further management.    Fayrene Helper, PA-C 09/09/16 1644    Tilden Fossa, MD 09/10/16 0001

## 2016-09-09 NOTE — ED Triage Notes (Signed)
Pt states she is unable to "close her jaw completely." Also c/o pain to her right TMJ and endorses crepitus to the same side. Remarks on recent tonsillectomy and adenectomy in February of this year.

## 2017-05-03 ENCOUNTER — Encounter: Payer: Self-pay | Admitting: Genetic Counselor

## 2017-05-03 ENCOUNTER — Other Ambulatory Visit: Payer: BLUE CROSS/BLUE SHIELD

## 2017-05-03 ENCOUNTER — Ambulatory Visit (HOSPITAL_BASED_OUTPATIENT_CLINIC_OR_DEPARTMENT_OTHER): Payer: BLUE CROSS/BLUE SHIELD | Admitting: Genetic Counselor

## 2017-05-03 DIAGNOSIS — Z803 Family history of malignant neoplasm of breast: Secondary | ICD-10-CM | POA: Insufficient documentation

## 2017-05-03 DIAGNOSIS — Z1379 Encounter for other screening for genetic and chromosomal anomalies: Secondary | ICD-10-CM

## 2017-05-03 HISTORY — DX: Encounter for other screening for genetic and chromosomal anomalies: Z13.79

## 2017-05-03 NOTE — Progress Notes (Signed)
[facility]      Initial Visit   Patient Name: [name] Patient DOB: 1985/06/25 Patient Age: 31 y.o. Encounter Date: 05/03/2017  Referring Provider: Gustavo Lah, NP  Primary Care Provider: Sharilyn Sites, MD  Reason for Visit: Evaluate for hereditary susceptibility to cancer    Assessment and Plan:  . [name] family history is not suggestive of a hereditary predisposition to cancer. However, given the Ashkenazi Jewish ancestry on her maternal side of the family where there are breast cancers, a genetics evaluation is warranted.  . Testing is recommended to determine whether she has a pathogenic mutation that will impact her screening and risk-reduction for cancer. A negative result will be reassuring.  . [name] wished to pursue genetic testing and a blood sample will be sent for analysis of the 47 genes on [facility]'s Common Cancers panel (APC, ATM, AXIN2, BARD1, BMPR1A, BRCA1, BRCA2, BRIP1, CDH1, CDK4, CDKN2A, CHEK2, CTNNA1, DICER1, EPCAM, GREM1, HOXB13, KIT, MEN1, MLH1, MSH2, MSH3, MSH6, MUTYH, NBN, NF1, NTHL1, PALB2, PDGFRA, PMS2, POLD1, POLE, PTEN, RAD50, RAD51C, RAD51D, SDHA, SDHB, SDHC, SDHD, SMAD4, SMARCA4, STK11, TP53, TSC1, TSC2, VHL).   . Results should be available in approximately 2-4 weeks, at which point we will contact her and address implications for her as well as address genetic testing for at-risk family members, if needed.      Dr. Jana Hakim was available for questions concerning this case. Total time spent by me in face-to-face counseling was approximately 35 minutes.   _____________________________________________________________________   History of Present Illness: [name], a 31 y.o. female, is being seen at the [facility] due to a family history of breast cancer. She presents to clinic today to discuss the possibility of a hereditary predisposition to cancer and discuss whether genetic  testing is warranted.  [name] has no personal history of cancer. She reported undergoing a yearly gynecologic exam.  Past Medical History:  Diagnosis Date  . Bipolar disorder ([place])   . Deep vein blood clot of left lower extremity ([place])   . Family history of breast cancer   . Myalgia   . Seasonal allergies      Social History   Socioeconomic History  . Marital status: Single    Spouse name: Not on file  . Number of children: Not on file  . Years of education: Not on file  . Highest education level: Not on file  Social Needs  . Financial resource strain: Not on file  . Food insecurity - worry: Not on file  . Food insecurity - inability: Not on file  . Transportation needs - medical: Not on file  . Transportation needs - non-medical: Not on file  Occupational History  . Not on file  Tobacco Use  . Smoking status: Never Smoker  . Smokeless tobacco: Never Used  Substance and Sexual Activity  . Alcohol use: Yes    Comment: occasionally  . Drug use: No  . Sexual activity: Yes    Birth control/protection: Injection  Other Topics Concern  . Not on file  Social History Narrative  . Not on file     Family History:  During the visit, a 4-generation pedigree was obtained. Family tree will be scanned in the Media tab in Epic  Significant diagnoses include the following:  Family History  Problem Relation Age of Onset  . Prostate cancer Paternal Uncle        deceased  54s  . Breast cancer Maternal Grandmother 74       2nd primary vs. recurrence in 49s  . Breast cancer Other        mother's paternal first cousin; dx 37s  . Breast cancer Other        paternal grandmother's sister; currently 32s  . Prostate cancer Paternal Uncle        deceased 49s    Additionally, [name] has no children. She has one sister (age 55). Her mother (age 107) had a TAH/BSO in her 17s, unrelated to cancer. Her mother has a sister (age 74) and a brother (age 56s). Her father (age 69) has a  total of 5 brothers and 6 sisters.  [name] ancestry is Caucasian - NOS. There is Jewish ancestry on her maternal side of the family. There is no consanguinity.  Discussion: We reviewed the characteristics, features and inheritance patterns of hereditary cancer syndromes. We discussed her risk of harboring a mutation in the context of her personal and family history as well as Ashkenazi Jewish ancestry in her maternal family. We discussed the process of genetic testing, insurance coverage and implications of results: positive, negative and variant of unknown significance (VUS).    [name] questions were answered to her satisfaction today and she is welcome to call with any additional questions or concerns. Thank you for the referral and allowing [place] to share in the care of your patient.    Steele Berg, MS, [place] Certified Genetic Counselor phone: [phone] [email or web address]   ______________________________________________________________________ For Office Staff:  Number of people involved in session: 1 Was an Intern/ student involved with case: no

## 2017-05-13 ENCOUNTER — Encounter: Payer: Self-pay | Admitting: Genetic Counselor

## 2017-05-13 ENCOUNTER — Ambulatory Visit: Payer: Self-pay | Admitting: Genetic Counselor

## 2017-05-13 DIAGNOSIS — Z1379 Encounter for other screening for genetic and chromosomal anomalies: Secondary | ICD-10-CM

## 2017-05-13 NOTE — Progress Notes (Signed)
               [facility]       Genetic Test Results    Patient Name: [name] Patient DOB: 09/21/1985 Patient Age: 31 y.o. Encounter Date: 05/13/2017  Referring Provider: Julie Fisher, NP  Primary Care Provider: Golding, John, MD   [name] was called today to discuss genetic test results. Please see the Genetics note from her visit on 05/03/2017 for a detailed discussion of her personal and family history.  Genetic Testing: At the time of [name]'s visit, she decided to pursue genetic testing of multiple genes associated with hereditary susceptibility to cancer. Testing included sequencing and deletion/duplication analysis. Testing did not reveal any pathogenic mutation in any of these genes.  A copy of the genetic test report will be scanned into Epic under the media tab.  The genes analyzed were the 47 genes on [facility]'s Common Cancers panel (APC, ATM, AXIN2, BARD1, BMPR1A, BRCA1, BRCA2, BRIP1, CDH1, CDK4, CDKN2A, CHEK2, CTNNA1, DICER1, EPCAM, GREM1, HOXB13, KIT, MEN1, MLH1, MSH2, MSH3, MSH6, MUTYH, NBN, NF1, NTHL1, PALB2, PDGFRA, PMS2, POLD1, POLE, PTEN, RAD50, RAD51C, RAD51D, SDHA, SDHB, SDHC, SDHD, SMAD4, SMARCA4, STK11, TP53, TSC1, TSC2, VHL).  Since the current test is not perfect, it is possible that there may be a gene mutation that current testing cannot detect, but that chance is small. It is possible that a different genetic factor, which has not yet been discovered or is not on this panel, is responsible for the cancer diagnoses in the family. Again, the likelihood of this is low. No additional testing is recommended at this time for [name].  A Variant of Uncertain Significance was detected: PALB2 [id number] ([id number]). This is still considered a normal result. While at this time, it is unknown if this finding is associated with increased cancer risk, the majority of these variants get reclassified to be inconsequential. We emphasized that medical  management should not be based on this finding. With time, we suspect the lab will determine the significance, if any. If we do learn more about it, we will try to contact [name] to discuss it further. It is important to stay in touch with us periodically and keep the address and phone number up to date.  Cancer Screening: These results are reassuring and indicate that [name] does not likely have an increased risk of cancer due to a mutation in one of these genes. We discussed undergoing cancer screenings for individuals in the general population. The National Comprehensive Cancer Network recommends that women follow the breast screening recommendations below, but that these may need to be modified based on other risk factors such as dense breasts, biopsy history or family history.  Breast awareness - Women should be familiar with their breasts and promptly report changes to their healthcare provider.   Between ages 25-39: Breast exam, risk assessment, and risk reduction counseling by the provider every 1-3 years.   Starting at age 40: Breast exam, risk assessment, and risk reduction counseling by the provider and mammogram every year. The provider may discuss screening with tomosynthesis.  [name] is also recommended to continue and undergo a yearly gynecologic exam and initiate colon cancer screening at age 50.  Family Members: Family members are recommended to speak with their own providers about appropriate cancer screenings. Any relative who had cancer at a young age or had a particularly rare cancer may also wish to pursue genetic testing. Genetic counselors can be   located in other cities, by visiting the website of the National Society of Genetic Counselors (www.nsgc.org) and searching for a genetic counselor by zip code.   Family members are not recommended to get tested for the above VUS outside of a research protocol as this finding has no implications for their medical management.      Lastly, cancer genetics is a rapidly advancing field and it is possible that new genetic tests will be appropriate for [name] in the future. We encourage her to remain in contact with us on an annual basis so we can update her personal and family histories, and let her know of advances in cancer genetics that may benefit the family. Our contact number was provided. [name] is welcome to call anytime with additional questions.      , MS, CGC Certified Genetic Counselor phone: [phone] 

## 2017-06-03 ENCOUNTER — Encounter (HOSPITAL_COMMUNITY): Payer: Self-pay | Admitting: Nurse Practitioner

## 2017-06-03 ENCOUNTER — Emergency Department (HOSPITAL_COMMUNITY)
Admission: EM | Admit: 2017-06-03 | Discharge: 2017-06-03 | Disposition: A | Discharge status: Home / Self Care | Payer: BLUE CROSS/BLUE SHIELD | Attending: Emergency Medicine | Admitting: Emergency Medicine

## 2017-06-03 ENCOUNTER — Other Ambulatory Visit: Payer: Self-pay

## 2017-06-03 DIAGNOSIS — R0602 Shortness of breath: Secondary | ICD-10-CM | POA: Insufficient documentation

## 2017-06-03 DIAGNOSIS — Z5321 Procedure and treatment not carried out due to patient leaving prior to being seen by health care provider: Secondary | ICD-10-CM | POA: Diagnosis not present

## 2017-06-03 NOTE — ED Triage Notes (Signed)
Patient drove herself to the ER for bronchitis. Patient was at [facility] and was diagnosis with Bronchitis was given a shot of steroids and antibiotics doxycycline and sent home. Patient states she doesn't feel any better and now she has discomfort when she take a deep breath. She is worried it is pneumonia.  Patient states she had a chest xray over there that showed fluid on her lungs.

## 2018-12-10 ENCOUNTER — Encounter: Payer: Self-pay | Admitting: Emergency Medicine

## 2018-12-10 ENCOUNTER — Other Ambulatory Visit: Payer: Self-pay

## 2018-12-10 ENCOUNTER — Emergency Department: Payer: BLUE CROSS/BLUE SHIELD

## 2018-12-10 ENCOUNTER — Emergency Department
Admission: EM | Admit: 2018-12-10 | Discharge: 2018-12-10 | Disposition: A | Discharge status: Home / Self Care | Payer: BLUE CROSS/BLUE SHIELD | Attending: Emergency Medicine | Admitting: Emergency Medicine

## 2018-12-10 DIAGNOSIS — R1032 Left lower quadrant pain: Secondary | ICD-10-CM | POA: Insufficient documentation

## 2018-12-10 DIAGNOSIS — N83201 Unspecified ovarian cyst, right side: Secondary | ICD-10-CM | POA: Diagnosis not present

## 2018-12-10 DIAGNOSIS — Z79899 Other long term (current) drug therapy: Secondary | ICD-10-CM | POA: Insufficient documentation

## 2018-12-10 DIAGNOSIS — R109 Unspecified abdominal pain: Secondary | ICD-10-CM

## 2018-12-10 LAB — BASIC METABOLIC PANEL
Anion gap: 8 (ref 5–15)
BUN: 14 mg/dL (ref 6–20)
CO2: 26 mmol/L (ref 22–32)
Calcium: 9.3 mg/dL (ref 8.9–10.3)
Chloride: 105 mmol/L (ref 98–111)
Creatinine, Ser: 0.89 mg/dL (ref 0.44–1.00)
GFR calc Af Amer: >60 mL/min (ref >60)
GFR calc non Af Amer: >60 mL/min (ref >60)
Glucose, Bld: 96 mg/dL (ref 70–99)
Potassium: 4.3 mmol/L (ref 3.5–5.1)
Sodium: 139 mmol/L (ref 135–145)

## 2018-12-10 LAB — URINALYSIS, COMPLETE (UACMP) WITH MICROSCOPIC
Bacteria, UA: NONE SEEN
Bilirubin Urine: NEGATIVE
Glucose, UA: NEGATIVE mg/dL
Hgb urine dipstick: NEGATIVE
Ketones, ur: NEGATIVE mg/dL
Leukocytes,Ua: NEGATIVE
Nitrite: NEGATIVE
Protein, ur: NEGATIVE mg/dL
Specific Gravity, Urine: 1.005 (ref 1.005–1.030)
pH: 8 (ref 5.0–8.0)

## 2018-12-10 LAB — CBC WITH DIFFERENTIAL/PLATELET
Abs Immature Granulocytes: 0.01 10*3/uL (ref 0.00–0.07)
Basophils Absolute: 0 10*3/uL (ref 0.0–0.1)
Basophils Relative: 1 %
Eosinophils Absolute: 0.1 10*3/uL (ref 0.0–0.5)
Eosinophils Relative: 1 %
HCT: 40.3 % (ref 36.0–46.0)
Hemoglobin: 13.8 g/dL (ref 12.0–15.0)
Immature Granulocytes: 0 %
Lymphocytes Relative: 29 %
Lymphs Abs: 1.3 10*3/uL (ref 0.7–4.0)
MCH: 33.3 pg (ref 26.0–34.0)
MCHC: 34.2 g/dL (ref 30.0–36.0)
MCV: 97.1 fL (ref 80.0–100.0)
Monocytes Absolute: 0.3 10*3/uL (ref 0.1–1.0)
Monocytes Relative: 8 %
Neutro Abs: 2.6 10*3/uL (ref 1.7–7.7)
Neutrophils Relative %: 61 %
Platelets: 201 10*3/uL (ref 150–400)
RBC: 4.15 MIL/uL (ref 3.87–5.11)
RDW: 10.8 % — ABNORMAL LOW (ref 11.5–15.5)
WBC: 4.3 10*3/uL (ref 4.0–10.5)
nRBC: 0 % (ref 0.0–0.2)

## 2018-12-10 LAB — POCT PREGNANCY, URINE: Preg Test, Ur: NEGATIVE

## 2018-12-10 MED ORDER — IOHEXOL 300 MG/ML  SOLN
100.0000 mL | Freq: Once | INTRAMUSCULAR | Status: AC | PRN
  Administered 2018-12-10: 11:00:00 100 mL via INTRAVENOUS

## 2018-12-10 MED ORDER — KETOROLAC TROMETHAMINE 30 MG/ML IJ SOLN
15.0000 mg | INTRAMUSCULAR | Status: AC
  Administered 2018-12-10: 15 mg via INTRAVENOUS
  Filled 2018-12-10: qty 1

## 2018-12-10 NOTE — ED Triage Notes (Addendum)
Pt to triage via w/c with no distress noted; reports left lower back pain with urinary frequency since yesterday; denies any abd pain

## 2018-12-10 NOTE — ED Provider Notes (Addendum)
[facility] Emergency Department Provider Note  ____________________________________________  Time seen: Approximately 10:39 AM  I have reviewed the triage vital signs and the nursing notes.   HISTORY  Chief Complaint Back Pain and Urinary Frequency    HPI [name] is a 33 y.o. female with a history of bipolar disorder who comes the ED complaining of left lower flank pain radiating to left lower quadrant abdomen that started yesterday, waxing and waning, no aggravating or alleviating factors.  Associated urinary frequency.  No fevers or chills chest pain or shortness of breath vomiting or diarrhea.  Reports a history of kidney infection.  Currently moderate intensity and "sharp and dull"  No abnormal vaginal bleeding or discharge  Past Medical History:  Diagnosis Date  . Bipolar disorder ([facility])   . Deep vein blood clot of left lower extremity ([facility])   . Family history of breast cancer   . Genetic testing 05/03/2017   Common Cancers panel (47 genes) @ [facility] - No pathogenic mutations detected  . Myalgia   . Seasonal allergies      Patient Active Problem List   Diagnosis Date Noted  . Genetic testing 05/03/2017  . Family history of breast cancer      History reviewed. No pertinent surgical history.   Prior to Admission medications   Medication Sig Start Date End Date Taking? Authorizing Provider  ARIPiprazole (ABILIFY) 20 MG tablet Take 20 mg by mouth at bedtime.    Yes [provider]  Multiple Vitamins-Minerals (WOMENS DAILY FORMULA PO) Take 1 tablet by mouth daily.   Yes [provider]  BLISOVI FE 1.5/30 1.5-30 MG-MCG tablet Take 1 tablet by mouth daily. 05/21/16   [provider]  docusate sodium (COLACE) 100 MG capsule Take 1 capsule (100 mg total) by mouth 2 (two) times daily. Patient not taking: Reported on 12/10/2018 06/08/16   Lorre NickAllen, Anthony, MD  EPINEPHrine (EPI-PEN) 0.3 mg/0.3 mL DEVI Inject 0.3 mg  into the muscle as needed. Allergic reactions  04/26/11   Samuel JesterMcManus, Kathleen, DO  fluticasone [facility](FLONASE) 50 MCG/ACT nasal spray Place 2 sprays into the nose at bedtime. 05/21/16   [provider]  ibuprofen (ADVIL,MOTRIN) 200 MG tablet Take 2 tablets (400 mg total) by mouth every 6 (six) hours as needed for fever, headache, mild pain, moderate pain or cramping. 09/09/16   Fayrene Helperran, Bowie, PA-C  methocarbamol (ROBAXIN-750) 750 MG tablet Take 1 tablet (750 mg total) by mouth 4 (four) times daily. Patient not taking: Reported on 12/10/2018 09/09/16   Fayrene Helperran, Bowie, PA-C  Oxcarbazepine (TRILEPTAL) 300 MG tablet Take 300 mg by mouth at bedtime. 06/01/16   [provider]  oxyCODONE-acetaminophen (PERCOCET/ROXICET) 5-325 MG tablet Take 1-2 tablets by mouth every 4 (four) hours as needed for severe pain. Patient not taking: Reported on 12/10/2018 06/08/16   Lorre NickAllen, Anthony, MD     Allergies Dust mite extract and Pollen extract   Family History  Problem Relation Age of Onset  . Prostate cancer Paternal Uncle        deceased 6360s  . Breast cancer Maternal Grandmother 45       2nd primary vs. recurrence in 7550s  . Breast cancer Other        mother's paternal first cousin; dx 4240s  . Breast cancer Other        paternal grandmother's sister; currently 5170s  . Prostate cancer Paternal Uncle        deceased 4360s    Social History Social  History   Tobacco Use  . Smoking status: Never Smoker  . Smokeless tobacco: Never Used  Substance Use Topics  . Alcohol use: Yes    Comment: occasionally  . Drug use: No    Review of Systems  Constitutional:   No fever or chills.  ENT:   No sore throat. No rhinorrhea. Cardiovascular:   No chest pain or syncope. Respiratory:   No dyspnea or cough. Gastrointestinal:   Positive as above for left flank and lower abdominal pain without vomiting and diarrhea.  Musculoskeletal:   Negative for focal pain or swelling All other systems reviewed and are negative  except as documented above in ROS and HPI.  ____________________________________________   PHYSICAL EXAM:  VITAL SIGNS: ED Triage Vitals  Enc Vitals Group     BP 12/10/18 0656 (!) 142/87     Pulse Rate 12/10/18 0656 83     Resp 12/10/18 0656 18     Temp 12/10/18 0656 98.3 F (36.8 C)     Temp Source 12/10/18 0656 Oral     SpO2 12/10/18 0656 100 %     Weight 12/10/18 0653 150 lb (68 kg)     Height 12/10/18 0653 5\' 4"  (1.626 m)     Head Circumference --      Peak Flow --      Pain Score 12/10/18 0653 8     Pain Loc --      Pain Edu? --      Excl. in GC? --     Vital signs reviewed, nursing assessments reviewed.   Constitutional:   Alert and oriented. Non-toxic appearance. Eyes:   Conjunctivae are normal. EOMI. PERRL. ENT      Head:   Normocephalic and atraumatic.      Neck:   No meningismus. Full ROM. Hematological/Lymphatic/Immunilogical:   No cervical lymphadenopathy. Cardiovascular:   RRR. Symmetric bilateral radial and DP pulses.  No murmurs. Cap refill less than 2 seconds. Respiratory:   Normal respiratory effort without tachypnea/retractions. Breath sounds are clear and equal bilaterally. No wheezes/rales/rhonchi. Gastrointestinal:   Soft with left-sided abdominal tenderness.  Non distended. There is left CVA tenderness.  No rebound, rigidity, or guarding.  Musculoskeletal:   Normal range of motion in all extremities. No joint effusions.  No lower extremity tenderness.  No edema. Neurologic:   Normal speech and language.  Motor grossly intact. No acute focal neurologic deficits are appreciated.  Skin:    Skin is warm, dry and intact. No rash noted.  No petechiae, purpura, or bullae.  ____________________________________________    LABS (pertinent positives/negatives) (all labs ordered are listed, but only abnormal results are displayed) Labs Reviewed  URINALYSIS, COMPLETE (UACMP) WITH MICROSCOPIC - Abnormal; Notable for the following components:      Result  Value   Color, Urine YELLOW (*)    APPearance HAZY (*)    Non Squamous Epithelial PRESENT (*)    All other components within normal limits  CBC WITH DIFFERENTIAL/PLATELET - Abnormal; Notable for the following components:   RDW 10.8 (*)    All other components within normal limits  URINE CULTURE  BASIC METABOLIC PANEL  POC URINE PREG, ED  POCT PREGNANCY, URINE   ____________________________________________   EKG    ____________________________________________    RADIOLOGY  Ct Abdomen Pelvis W Contrast  Result Date: 12/10/2018 CLINICAL DATA:  33 year old female with history of acute generalized abdominal pain in the left lower flank region radiating into the left lower quadrant. Associated urinary frequency. EXAM: CT  ABDOMEN AND PELVIS WITH CONTRAST TECHNIQUE: Multidetector CT imaging of the abdomen and pelvis was performed using the standard protocol following bolus administration of intravenous contrast. CONTRAST:  100mL OMNIPAQUE IOHEXOL 300 MG/ML  SOLN COMPARISON:  CT the abdomen and pelvis 06/03/2016. FINDINGS: Lower chest: Unremarkable. Hepatobiliary: No suspicious cystic or solid hepatic lesions. No intra or extrahepatic biliary ductal dilatation. Gallbladder is normal in appearance. Pancreas: No pancreatic mass. No pancreatic ductal dilatation. No pancreatic or peripancreatic fluid collections or inflammatory changes. Spleen: Unremarkable. Adrenals/Urinary Tract: Bilateral kidneys and adrenal glands are normal in appearance. No hydroureteronephrosis. Urinary bladder is normal in appearance. Stomach/Bowel: Normal appearance of the stomach. No pathologic dilatation of small bowel or colon. Normal appendix. Vascular/Lymphatic: No significant atherosclerotic disease, aneurysm or dissection noted in the abdominal or pelvic vasculature. No lymphadenopathy noted in the abdomen or pelvis. Reproductive: Uterus and left ovary are unremarkable in appearance. In the right ovary there is a  well-defined 1.8 cm low-attenuation rim enhancing lesion, most compatible with a small degenerating corpus luteum cyst. Other: Trace volume of free fluid in the low anatomic pelvis, presumably physiologic in this young female patient. No larger volume of ascites. No pneumoperitoneum. Musculoskeletal: There are no aggressive appearing lytic or blastic lesions noted in the visualized portions of the skeleton. IMPRESSION: 1. No acute findings are noted in the abdomen or pelvis to account for the patient's symptoms. 2. There is, however, what looks like a degenerating corpus luteum cyst in the right ovary with trace volume of physiologic free fluid in the low anatomic pelvis. Electronically Signed   By: Trudie Reedaniel  Entrikin M.D.   On: 12/10/2018 11:15    ____________________________________________   PROCEDURES Procedures  ____________________________________________  DIFFERENTIAL DIAGNOSIS   Cystitis, pyelonephritis, ureterolithiasis, diverticulitis, ovarian cyst  CLINICAL IMPRESSION / ASSESSMENT AND PLAN / ED COURSE  Medications ordered in the ED: Medications  ketorolac (TORADOL) 30 MG/ML injection 15 mg (15 mg Intravenous Given 12/10/18 0744)  iohexol (OMNIPAQUE) 300 MG/ML solution 100 mL (100 mLs Intravenous Contrast Given 12/10/18 1036)    Pertinent labs & imaging results that were available during my care of the patient were reviewed by me and considered in my medical decision making (see chart for details).  [name] was evaluated in Emergency Department on 12/10/2018 for the symptoms described in the history of present illness. She was evaluated in the context of the global COVID-19 pandemic, which necessitated consideration that the patient might be at risk for infection with the SARS-CoV-2 virus that causes COVID-19. Institutional protocols and algorithms that pertain to the evaluation of patients at risk for COVID-19 are in a state of rapid change based on information released  by regulatory bodies including the CDC and federal and state organizations. These policies and algorithms were followed during the patient's care in the ED.   Patient presents with left flank pain, vital signs unremarkable.  Highest suspicion for urinary pathology.  Initial labs are completely normal.  Pain is controlled with IV Toradol, but with the degree of her pain without any explanation on urinalysis, I will obtain a CT scan of the abdomen and pelvis for further evaluation.   ----------------------------------------- 12:28 PM on 12/10/2018 -----------------------------------------  CT unremarkable.  They do see a small corpus luteum cyst on the right ovary which appears to be unrelated to her symptoms today.  Pregnancy test today was negative and there is no sign of a ruptured ectopic or cyst.     ____________________________________________   FINAL CLINICAL IMPRESSION(S) / ED DIAGNOSES  Final diagnoses:  Left flank pain  Cyst of right ovary     ED Discharge Orders    None      Portions of this note were generated with dragon dictation software. Dictation errors may occur despite best attempts at proofreading.   Carrie Mew, MD 12/10/18 1042    Carrie Mew, MD 12/10/18 1228

## 2018-12-10 NOTE — ED Notes (Signed)
Patient transported to CT 

## 2018-12-10 NOTE — Discharge Instructions (Signed)
Your labs today were normal.  Your CT scan overall did not find any acute issues or reason for your pain, but is reassuring.  They did see a small (1.6 cm) cyst on your right ovary, which does not appear to be related to your symptoms today.  This will likely disappear on its own.  Please follow-up with primary care in 2 days if your symptoms have not resolved.  In the meantime, continue taking anti-inflammatory medicine such as ibuprofen and Aleve for pain control.

## 2018-12-11 LAB — URINE CULTURE: Culture: 3000 — AB

## 2018-12-17 DIAGNOSIS — M2392 Unspecified internal derangement of left knee: Secondary | ICD-10-CM | POA: Diagnosis not present

## 2018-12-22 ENCOUNTER — Other Ambulatory Visit: Payer: Self-pay | Admitting: Obstetrics and Gynecology

## 2018-12-22 DIAGNOSIS — F3175 Bipolar disorder, in partial remission, most recent episode depressed: Secondary | ICD-10-CM | POA: Diagnosis not present

## 2018-12-22 DIAGNOSIS — Z8659 Personal history of other mental and behavioral disorders: Secondary | ICD-10-CM | POA: Diagnosis not present

## 2018-12-22 DIAGNOSIS — F411 Generalized anxiety disorder: Secondary | ICD-10-CM | POA: Diagnosis not present

## 2018-12-22 DIAGNOSIS — F410 Panic disorder [episodic paroxysmal anxiety] without agoraphobia: Secondary | ICD-10-CM | POA: Diagnosis not present

## 2018-12-24 DIAGNOSIS — R1084 Generalized abdominal pain: Secondary | ICD-10-CM | POA: Diagnosis not present

## 2018-12-24 DIAGNOSIS — N946 Dysmenorrhea, unspecified: Secondary | ICD-10-CM | POA: Diagnosis not present

## 2018-12-24 DIAGNOSIS — L309 Dermatitis, unspecified: Secondary | ICD-10-CM | POA: Diagnosis not present

## 2018-12-24 DIAGNOSIS — K5904 Chronic idiopathic constipation: Secondary | ICD-10-CM | POA: Diagnosis not present

## 2018-12-24 DIAGNOSIS — M533 Sacrococcygeal disorders, not elsewhere classified: Secondary | ICD-10-CM | POA: Diagnosis not present

## 2018-12-26 ENCOUNTER — Telehealth: Payer: BLUE CROSS/BLUE SHIELD | Admitting: Nurse Practitioner

## 2018-12-26 DIAGNOSIS — R197 Diarrhea, unspecified: Secondary | ICD-10-CM

## 2018-12-26 NOTE — Progress Notes (Signed)
Based on what you shared with me it looks like you have bloody stools ,that should be evaluated in a face to face office visit.    NOTE: If you entered your credit card information for this eVisit, you will not be charged. You may see a "hold" on your card for the $30 but that hold will drop off and you will not have a charge processed.  If you are having a true medical emergency please call 911.  If you need an urgent face to face visit, [place] has four urgent care centers for your convenience.  If you need care fast and have a high deductible or no insurance consider:   [email or web address] to reserve your spot online an avoid wait times  [facility] [address] [place], [place] [address] 8 am to 8 pm Monday-Friday 10 am to 4 pm Saturday-Sunday *Across the street from [employer]  [place], [address] 8 am to 5 pm Monday-Friday * In the [address] [facility] on the [facility]   The following sites will take your  insurance:  . [facility] [place] a Provider at this Location  [address] [place], [place] [address] . 10 am to 8 pm Monday-Friday . 12 pm to 8 pm Saturday-Sunday   . [facility] [place] a Provider at this Location  [address], [place], [place] [address] . 8 am to 8 pm Monday-Friday . 9 am to 6 pm Saturday . 11 am to 6 pm Sunday   . [facility] at [place] Get Driving Directions  [address] [place], [place] [address] . 8 am to 8 pm Monday-Friday . 8 am to 4 pm Saturday-Sunday   Your e-visit answers were reviewed by a board certified advanced clinical practitioner to complete your personal care plan.

## 2019-01-12 DIAGNOSIS — Z135 Encounter for screening for eye and ear disorders: Secondary | ICD-10-CM | POA: Diagnosis not present

## 2019-01-12 DIAGNOSIS — H52203 Unspecified astigmatism, bilateral: Secondary | ICD-10-CM | POA: Diagnosis not present

## 2019-01-12 DIAGNOSIS — H5213 Myopia, bilateral: Secondary | ICD-10-CM | POA: Diagnosis not present

## 2019-01-15 ENCOUNTER — Encounter: Payer: Self-pay | Admitting: Gastroenterology

## 2019-01-15 DIAGNOSIS — K625 Hemorrhage of anus and rectum: Secondary | ICD-10-CM | POA: Diagnosis not present

## 2019-01-15 DIAGNOSIS — R198 Other specified symptoms and signs involving the digestive system and abdomen: Secondary | ICD-10-CM | POA: Diagnosis not present

## 2019-01-15 DIAGNOSIS — K602 Anal fissure, unspecified: Secondary | ICD-10-CM | POA: Diagnosis not present

## 2019-02-02 DIAGNOSIS — K625 Hemorrhage of anus and rectum: Secondary | ICD-10-CM | POA: Diagnosis not present

## 2019-02-02 HISTORY — PX: COLONOSCOPY: SHX174

## 2019-02-04 DIAGNOSIS — K529 Noninfective gastroenteritis and colitis, unspecified: Secondary | ICD-10-CM | POA: Diagnosis not present

## 2019-02-05 DIAGNOSIS — K635 Polyp of colon: Secondary | ICD-10-CM | POA: Diagnosis not present

## 2019-03-23 DIAGNOSIS — R635 Abnormal weight gain: Secondary | ICD-10-CM | POA: Diagnosis not present

## 2019-03-23 DIAGNOSIS — Z79899 Other long term (current) drug therapy: Secondary | ICD-10-CM | POA: Diagnosis not present

## 2019-03-23 DIAGNOSIS — F410 Panic disorder [episodic paroxysmal anxiety] without agoraphobia: Secondary | ICD-10-CM | POA: Diagnosis not present

## 2019-03-23 DIAGNOSIS — F411 Generalized anxiety disorder: Secondary | ICD-10-CM | POA: Diagnosis not present

## 2019-03-23 DIAGNOSIS — F3175 Bipolar disorder, in partial remission, most recent episode depressed: Secondary | ICD-10-CM | POA: Diagnosis not present

## 2019-03-23 DIAGNOSIS — Z8659 Personal history of other mental and behavioral disorders: Secondary | ICD-10-CM | POA: Diagnosis not present

## 2019-04-04 DIAGNOSIS — K648 Other hemorrhoids: Secondary | ICD-10-CM | POA: Diagnosis not present

## 2019-04-04 DIAGNOSIS — K602 Anal fissure, unspecified: Secondary | ICD-10-CM | POA: Diagnosis not present

## 2019-04-08 ENCOUNTER — Telehealth: Payer: Self-pay

## 2019-04-08 NOTE — Telephone Encounter (Signed)
ROI fax to [facility] for patient records.

## 2019-04-13 MED FILL — ABILIFY 20 MG TABLET: 20 | 30 days supply | Qty: 30 | Fill #0

## 2019-04-30 DIAGNOSIS — F3175 Bipolar disorder, in partial remission, most recent episode depressed: Secondary | ICD-10-CM | POA: Diagnosis not present

## 2019-04-30 DIAGNOSIS — Z8659 Personal history of other mental and behavioral disorders: Secondary | ICD-10-CM | POA: Diagnosis not present

## 2019-04-30 DIAGNOSIS — F410 Panic disorder [episodic paroxysmal anxiety] without agoraphobia: Secondary | ICD-10-CM | POA: Diagnosis not present

## 2019-04-30 DIAGNOSIS — F411 Generalized anxiety disorder: Secondary | ICD-10-CM | POA: Diagnosis not present

## 2019-05-07 MED FILL — ABILIFY 20 MG TABLET: 20 | 90 days supply | Qty: 90 | Fill #0

## 2019-06-09 ENCOUNTER — Telehealth: Payer: Self-pay | Admitting: Gastroenterology

## 2019-06-09 NOTE — Telephone Encounter (Signed)
Dr. Chales Abrahams Doc of Day for 06/09/19 PM.  Patient is requesting to transfer from [facility] to our office. She states that she continues to have rectal bleeding and doesn't feel like she is getting any "help" at [facility]. Patient also states that her mother was recently diagnosed with stage 4 stomach cancer.   Records placed on Dr. Urban Gibson desk for review.

## 2019-06-09 NOTE — Telephone Encounter (Signed)
Trish Can you please sort out the records and give me tomorrow when I am in the clinic Thx  RG

## 2019-06-10 ENCOUNTER — Other Ambulatory Visit: Payer: Self-pay

## 2019-06-10 ENCOUNTER — Emergency Department (HOSPITAL_COMMUNITY): Payer: Managed Care, Other (non HMO)

## 2019-06-10 ENCOUNTER — Encounter (HOSPITAL_COMMUNITY): Payer: Self-pay

## 2019-06-10 ENCOUNTER — Emergency Department (HOSPITAL_COMMUNITY)
Admission: EM | Admit: 2019-06-10 | Discharge: 2019-06-10 | Disposition: A | Discharge status: Home / Self Care | Payer: Managed Care, Other (non HMO) | Attending: Emergency Medicine | Admitting: Emergency Medicine

## 2019-06-10 ENCOUNTER — Telehealth: Payer: Self-pay

## 2019-06-10 DIAGNOSIS — K602 Anal fissure, unspecified: Secondary | ICD-10-CM | POA: Diagnosis not present

## 2019-06-10 DIAGNOSIS — Z79899 Other long term (current) drug therapy: Secondary | ICD-10-CM | POA: Insufficient documentation

## 2019-06-10 DIAGNOSIS — K6289 Other specified diseases of anus and rectum: Secondary | ICD-10-CM

## 2019-06-10 LAB — URINALYSIS, ROUTINE W REFLEX MICROSCOPIC
Bilirubin Urine: NEGATIVE
Glucose, UA: NEGATIVE mg/dL
Hgb urine dipstick: NEGATIVE
Ketones, ur: NEGATIVE mg/dL
Leukocytes,Ua: NEGATIVE
Nitrite: NEGATIVE
Protein, ur: NEGATIVE mg/dL
Specific Gravity, Urine: 1.003 — ABNORMAL LOW (ref 1.005–1.030)
pH: 7 (ref 5.0–8.0)

## 2019-06-10 LAB — CBC
HCT: 43.8 % (ref 36.0–46.0)
Hemoglobin: 14.6 g/dL (ref 12.0–15.0)
MCH: 32.7 pg (ref 26.0–34.0)
MCHC: 33.3 g/dL (ref 30.0–36.0)
MCV: 98.2 fL (ref 80.0–100.0)
Platelets: 260 10*3/uL (ref 150–400)
RBC: 4.46 MIL/uL (ref 3.87–5.11)
RDW: 11.5 % (ref 11.5–15.5)
WBC: 4.5 10*3/uL (ref 4.0–10.5)
nRBC: 0 % (ref 0.0–0.2)

## 2019-06-10 LAB — COMPREHENSIVE METABOLIC PANEL
ALT: 26 U/L (ref 0–44)
AST: 26 U/L (ref 15–41)
Albumin: 5.1 g/dL — ABNORMAL HIGH (ref 3.5–5.0)
Alkaline Phosphatase: 51 U/L (ref 38–126)
Anion gap: 10 (ref 5–15)
BUN: 13 mg/dL (ref 6–20)
CO2: 28 mmol/L (ref 22–32)
Calcium: 9.8 mg/dL (ref 8.9–10.3)
Chloride: 104 mmol/L (ref 98–111)
Creatinine, Ser: 0.9 mg/dL (ref 0.44–1.00)
GFR calc Af Amer: >60 mL/min (ref >60)
GFR calc non Af Amer: >60 mL/min (ref >60)
Glucose, Bld: 85 mg/dL (ref 70–99)
Potassium: 3.8 mmol/L (ref 3.5–5.1)
Sodium: 142 mmol/L (ref 135–145)
Total Bilirubin: 0.9 mg/dL (ref 0.3–1.2)
Total Protein: 8.8 g/dL — ABNORMAL HIGH (ref 6.5–8.1)

## 2019-06-10 LAB — I-STAT BETA HCG BLOOD, ED (MC, WL, AP ONLY): I-stat hCG, quantitative: <5 m[IU]/mL (ref <5)

## 2019-06-10 LAB — LIPASE, BLOOD: Lipase: 27 U/L (ref 11–51)

## 2019-06-10 MED ORDER — SODIUM CHLORIDE 0.9% FLUSH: 3.0000 mL | Freq: Once | INTRAVENOUS | Status: DC

## 2019-06-10 MED ORDER — LIDOCAINE HCL URETHRAL/MUCOSAL 2 % EX GEL
1.0000 "application " | Freq: Once | CUTANEOUS | Status: AC
  Administered 2019-06-10: 1 via TOPICAL
  Filled 2019-06-10: qty 5

## 2019-06-10 MED ORDER — IOHEXOL 300 MG/ML  SOLN
100.0000 mL | Freq: Once | INTRAMUSCULAR | Status: AC | PRN
  Administered 2019-06-10: 100 mL via INTRAVENOUS

## 2019-06-10 MED ORDER — MORPHINE SULFATE (PF) 4 MG/ML IV SOLN
4.0000 mg | Freq: Once | INTRAVENOUS | Status: AC
  Administered 2019-06-10: 4 mg via INTRAVENOUS
  Filled 2019-06-10: qty 1

## 2019-06-10 MED ORDER — SODIUM CHLORIDE (PF) 0.9 % IJ SOLN
INTRAMUSCULAR | Status: AC
  Filled 2019-06-10: qty 50

## 2019-06-10 NOTE — Telephone Encounter (Addendum)
Delete wrong phone note

## 2019-06-10 NOTE — ED Notes (Signed)
Tammy, CT [facility], informed Rayfield Citizen, RN that when patient was receiving contrast dye her arm turned white and started burning. CT flushed IV and did not notice any infiltration. RN checked on patient's IV and no infiltration or blanching noted on patient's arm. RN able to flush IV and give medications no problem. Patient not complaining of any pain at this time. Will continue to monitor.

## 2019-06-10 NOTE — ED Provider Notes (Signed)
[facility]-EMERGENCY DEPT Provider Note   CSN: [id number] Arrival date & time: 06/10/19  1005     History Chief Complaint  Patient presents with  . Abdominal Pain  . Rectal Pain    [name] is a 34 y.o. female presenting for evaluation of abdominal pain and rectal pain.  Patient states she has been having rectal pain for at least 6 months.  She had a colonoscopy performed which showed polyps, hemorrhoids, and an anal fissure.  She was started on a cream, but reports no improvement with this.  Patient states over the past few days, she has had new and worsening upper abdominal pain.  She describes it as an ache, which is constant.  Nothing makes it better or worse.  Patient reports worsening rectal pain over the past 6 months.  She is planning on following up with a new GI doctor for second opinion, but is in the ER today due to continued and persistent pain.  She denies fevers, chills, chest pain, shortness of breath, nausea, vomiting, urinary symptoms.  Patient reports she is noticing blood and mucus in her stool.  She takes Abilify daily, no other medications.  Patient states she has a family history of colon cancer and stomach cancer.  Additional history obtained from chart review, patient with history of bipolar, family history of cancer, negative genetic testing.  HPI     Past Medical History:  Diagnosis Date  . Bipolar disorder (HCC)   . Deep vein blood clot of left lower extremity (HCC)   . Family history of breast cancer   . Genetic testing 05/03/2017   Common Cancers panel (47 genes) @ [facility] - No pathogenic mutations detected  . Myalgia   . Seasonal allergies     Patient Active Problem List   Diagnosis Date Noted  . Genetic testing 05/03/2017  . Family history of breast cancer     History reviewed. No pertinent surgical history.   OB History   No obstetric history on file.     Family History  Problem Relation Age of Onset    . Prostate cancer Paternal Uncle        deceased 67s  . Breast cancer Maternal Grandmother 45       2nd primary vs. recurrence in 64s  . Breast cancer Other        mother's paternal first cousin; dx 52s  . Breast cancer Other        paternal grandmother's sister; currently 80s  . Prostate cancer Paternal Uncle        deceased 60s    Social History   Tobacco Use  . Smoking status: Never Smoker  . Smokeless tobacco: Never Used  Substance Use Topics  . Alcohol use: Yes    Comment: occasionally  . Drug use: No    Home Medications Prior to Admission medications   Medication Sig Start Date End Date Taking? Authorizing Provider  ARIPiprazole (ABILIFY) 20 MG tablet Take 20 mg by mouth at bedtime.    Yes [provider]  hydrocortisone (ANUSOL-HC) 2.5 % rectal cream Place 1 application rectally 4 (four) times daily as needed for hemorrhoids or anal itching.   Yes [provider]  Melatonin 5 MG TABS Take 5 mg by mouth at bedtime.   Yes [provider]  Multiple Vitamin (MULTIVITAMIN WITH MINERALS) TABS tablet Take 1 tablet by mouth daily.   Yes [provider]  docusate sodium (COLACE) 100 MG capsule Take  1 capsule (100 mg total) by mouth 2 (two) times daily. Patient not taking: Reported on 12/10/2018 06/08/16   Lorre Nick, MD  ibuprofen (ADVIL,MOTRIN) 200 MG tablet Take 2 tablets (400 mg total) by mouth every 6 (six) hours as needed for fever, headache, mild pain, moderate pain or cramping. Patient not taking: Reported on 06/10/2019 09/09/16   Fayrene Helper, PA-C  methocarbamol (ROBAXIN-750) 750 MG tablet Take 1 tablet (750 mg total) by mouth 4 (four) times daily. Patient not taking: Reported on 12/10/2018 09/09/16   Fayrene Helper, PA-C  oxyCODONE-acetaminophen (PERCOCET/ROXICET) 5-325 MG tablet Take 1-2 tablets by mouth every 4 (four) hours as needed for severe pain. Patient not taking: Reported on 12/10/2018 06/08/16   Lorre Nick, MD    Allergies     Dust mite extract and Pollen extract  Review of Systems   Review of Systems  Gastrointestinal: Positive for abdominal pain and rectal pain.  All other systems reviewed and are negative.   Physical Exam Updated Vital Signs BP (!) 153/81   Pulse 78   Temp 97.6 F (36.4 C) (Oral)   Resp 16   Ht 5\' 4"  (1.626 m)   Wt 72.6 kg   LMP 06/07/2019 (Approximate)   SpO2 100%   BMI 27.46 kg/m   Physical Exam Vitals and nursing note reviewed. Exam conducted with a chaperone present.  Constitutional:      General: She is not in acute distress.    Appearance: She is well-developed.     Comments: Appears uncomfortable due to pain, otherwise nontoxic  HENT:     Head: Normocephalic and atraumatic.  Eyes:     Extraocular Movements: Extraocular movements intact.     Conjunctiva/sclera: Conjunctivae normal.     Pupils: Pupils are equal, round, and reactive to light.  Cardiovascular:     Rate and Rhythm: Normal rate and regular rhythm.     Pulses: Normal pulses.  Pulmonary:     Effort: Pulmonary effort is normal. No respiratory distress.     Breath sounds: Normal breath sounds. No wheezing.  Abdominal:     General: There is no distension.     Palpations: Abdomen is soft. There is no mass.     Tenderness: There is abdominal tenderness. There is no guarding or rebound.     Comments: Tenderness palpation of epigastric abdomen. Tenderness palpation also in the abdomen.  No rigidity, guarding, distention.  Negative rebound.  Negative Murphy's.  No signs peritonitis.  Genitourinary:    Rectum: Tenderness, anal fissure and external hemorrhoid present.     Comments: Extremely painful rectal exam. External hemorrhoid noted.  No gross blood noted on exam. ?palpated fissure Musculoskeletal:        General: Normal range of motion.     Cervical back: Normal range of motion and neck supple.  Skin:    General: Skin is warm and dry.  Neurological:     Mental Status: She is alert and oriented to  person, place, and time.     ED Results / Procedures / Treatments   Labs (all labs ordered are listed, but only abnormal results are displayed) Labs Reviewed  COMPREHENSIVE METABOLIC PANEL - Abnormal; Notable for the following components:      Result Value   Total Protein 8.8 (*)    Albumin 5.1 (*)    All other components within normal limits  URINALYSIS, ROUTINE W REFLEX MICROSCOPIC - Abnormal; Notable for the following components:   Color, Urine STRAW (*)    Specific  Gravity, Urine 1.003 (*)    All other components within normal limits  LIPASE, BLOOD  CBC  I-STAT BETA HCG BLOOD, ED ([facility], [facility], [facility] ONLY)    EKG None  Radiology CT ABDOMEN PELVIS W CONTRAST  Result Date: 06/10/2019 CLINICAL DATA:  Abdominal pain. Difficulty with bowel movements. Rectal pain. EXAM: CT ABDOMEN AND PELVIS WITH CONTRAST TECHNIQUE: Multidetector CT imaging of the abdomen and pelvis was performed using the standard protocol following bolus administration of intravenous contrast. CONTRAST:  124mL OMNIPAQUE IOHEXOL 300 MG/ML  SOLN COMPARISON:  12/10/2018 FINDINGS: Lower chest: Clear lung bases.  Heart normal in size. Hepatobiliary: No focal liver abnormality is seen. No gallstones, gallbladder wall thickening, or biliary dilatation. Pancreas: Unremarkable. No pancreatic ductal dilatation or surrounding inflammatory changes. Spleen: Normal in size without focal abnormality. Adrenals/Urinary Tract: Adrenal glands are unremarkable. Kidneys are normal, without renal calculi, focal lesion, or hydronephrosis. Bladder is unremarkable. Stomach/Bowel: Stomach is within normal limits. Appendix appears normal. No evidence of bowel wall thickening, distention, or inflammatory changes. Vascular/Lymphatic: No significant vascular findings are present. No enlarged abdominal or pelvic lymph nodes. Reproductive: Uterus and bilateral adnexa are unremarkable. Other: No perirectal or perineal mass, fluid collection or inflammation. No  abdominal wall hernia. No ascites. Musculoskeletal: No acute or significant osseous findings. IMPRESSION: 1. Normal exam.  No findings to account for the patient's symptoms. Electronically Signed   By: Lajean Manes M.D.   On: 06/10/2019 13:22    Procedures Procedures (including critical care time)  Medications Ordered in ED Medications  sodium chloride flush (NS) 0.9 % injection 3 mL (0 mLs Intravenous Hold 06/10/19 1035)  sodium chloride (PF) 0.9 % injection (has no administration in time range)  morphine 4 MG/ML injection 4 mg (4 mg Intravenous Given 06/10/19 1046)  lidocaine (XYLOCAINE) 2 % jelly 1 application (1 application Topical Given 06/10/19 1308)  morphine 4 MG/ML injection 4 mg (4 mg Intravenous Given 06/10/19 1308)  iohexol (OMNIPAQUE) 300 MG/ML solution 100 mL (100 mLs Intravenous Contrast Given 06/10/19 1250)    ED Course  I have reviewed the triage vital signs and the nursing notes.  Pertinent labs & imaging results that were available during my care of the patient were reviewed by me and considered in my medical decision making (see chart for details).    MDM Rules/Calculators/A&P                      Patient presenting for evaluation of abdominal pain and rectal pain.  Physical exam shows patient appears uncomfortable, but otherwise nontoxic.  She has significant pain with rectal exam, with ?fissure.  Will obtain labs for further evaluation.  Patient has had worsening pain over 6 months, as such consider option of CT.  Chart reviewed and patient had normal CT in July 2020.  Discussed with patient, who would like to proceed with a CT today.  Labs overall reassuring.  No leukocytosis.  Electrolytes stable.  CT pending.  On reevaluation after pain control, patient reports mild improvement.  CT negative for acute findings.  Discussed with patient.  Discussed importance of follow-up with GI for further evaluation.  Discussed short course of pain control as needed, patient  declines.  Discussed continued symptomatic treatment with lidocaine jelly, sitz bath's, and making sure stool softer.  At this time, patient appears safe for discharge.  Return precautions given.  Patient states she understands and agrees to plan.  Final Clinical Impression(s) / ED Diagnoses Final diagnoses:  Anal fissure  Rectal pain    Rx / DC Orders ED Discharge Orders    None       Alveria Apley, PA-C 06/10/19 1447    Jacalyn Lefevre, MD 06/10/19 1449

## 2019-06-10 NOTE — ED Triage Notes (Signed)
Patient c/o mid abdominal pain and rectal pain with drainage since July/2020. Patient states she feels like she has lump inside her rectum.

## 2019-06-10 NOTE — Telephone Encounter (Signed)
This is on your desk separate from the rest for you to review.

## 2019-06-10 NOTE — Telephone Encounter (Signed)
Rec'd from [employer] forwarded 22 pages to [employer]

## 2019-06-10 NOTE — Discharge Instructions (Signed)
Make sure you are staying well-hydrated water. Decrease your ibuprofen intake, use Tylenol instead.  The ibuprofen may be irritating your stomach. Take Colace or other stool softeners as needed to make bowel movements less painful. Continue using sitz bath's to help with pain. Continue using lidocaine jelly for pain control. It is important to follow-up with [facility] gastroenterology for further management of your symptoms. Return to the emergency room if any new, worsening, concerning symptoms.

## 2019-06-13 NOTE — Telephone Encounter (Signed)
Few records from [facility] reviewed Had colonoscopy 01/2019 by Dr. Georgie Chard were all negative.  No colonoscopy report.  From the notes it seems that she had hemorrhoids or anal fissure.  Plan: -Please get the colonoscopy report. -Can make appointment with myself or Jill Side (whoever is available first).  Should be seen in person. -Please have the records available at the time of visit.   Trish-I have placed previous records on your desk.  Thx  RG

## 2019-06-15 ENCOUNTER — Encounter: Payer: Self-pay | Admitting: Gastroenterology

## 2019-06-15 NOTE — Telephone Encounter (Signed)
LM on vmail to call back to schedule, however patient will need colon report at time of OV per Dr. Chales Abrahams.

## 2019-06-15 NOTE — Telephone Encounter (Signed)
I have requested these records from [facility].

## 2019-06-25 ENCOUNTER — Encounter: Payer: Self-pay | Admitting: Gastroenterology

## 2019-06-25 ENCOUNTER — Other Ambulatory Visit: Payer: Self-pay

## 2019-06-25 ENCOUNTER — Ambulatory Visit: Payer: Managed Care, Other (non HMO) | Admitting: Gastroenterology

## 2019-06-25 VITALS — BP 128/82 | HR 76 | Temp 98.6°F | Ht 64.0 in | Wt 157.2 lb

## 2019-06-25 DIAGNOSIS — K6289 Other specified diseases of anus and rectum: Secondary | ICD-10-CM | POA: Diagnosis not present

## 2019-06-25 DIAGNOSIS — K602 Anal fissure, unspecified: Secondary | ICD-10-CM | POA: Diagnosis not present

## 2019-06-25 NOTE — Patient Instructions (Signed)
If you are age 33 or older, your body mass index should be between 23-30. Your Body mass index is 26.99 kg/m. If this is out of the aforementioned range listed, please consider follow up with your Primary Care Provider.  If you are age 92 or younger, your body mass index should be between 19-25. Your Body mass index is 26.99 kg/m. If this is out of the aformentioned range listed, please consider follow up with your Primary Care Provider.   You have been referred to [facility]. You will be contacted by their office with an appointment.   Continue Fiber.   Thank you,  Dr. Lynann Bologna

## 2019-06-25 NOTE — Progress Notes (Signed)
Chief Complaint: Rectal pain/bleeding  Referring Provider:  Assunta Found, MD      ASSESSMENT AND PLAN;   #1. 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 Too tender for rectal exam. No Crohns/rectal abscess. Neg colon to TI 01/2019 @ [facility]. Neg CT AP 05/2019, 11/2018.  Failed extensive medical treatment.  Plan: -Refer to surgery (Dr Ginnie Smart) as soon as available for possible EUA with surgical treatment. -Continue fiber, NTG 0.125mg  BID for now. -Consider diltiazem cream.  Not sure if it will help.  Also Botox injection with 50% recurrence rate may not help.  Patient would like to have definitive treatment.   HPI:    [name] is a 34 y.o. female  Here for second opinion Has had rectal pain with rectal bleeding since July 2020, progressively getting worse. " Very distressed with the symptoms".  Hurts even to sit down.  Denies having any significant constipation.  Has been on Benefiber.  Does have increasing rectal pain when she strains.  Bleeding is mostly bright red, away from the stools.  She brings in pictures.  Occasional abdominal bloating.  She uses PRN ibuprofen, PRN hydrocodone when the pain gets really worse  Underwent colonoscopy 02/02/2019 @ [facility] -Normal colonoscopy to TI -Single polyp in the rectum s/p polypectomy. Bx-hyperplastic polyp. -S/P random colonic biopsies: neg for microscopic colitis. -Hypertrophied anal papilla in the rectum  CT AP 06/10/2019: Normal exam.  No rectal abscesses/perirectal or perianal masses.  CT AP 11/2018: No acute abnormalities.   Dx and treated as anal fissure according to the notes  Previous therapies: fiber (benefiber), linzess, HC 2%, lidocaine gel, NTG gel (rectiv), RectiCare, anamantle HC without any significant relief.  Rectal pain is so bad that it is "taking over her life".  Denies having any upper GI symptoms including nausea, vomiting, heartburn, odynophagia or dysphagia.  No weight loss.  Past  Medical History:  Diagnosis Date  . Anxiety   . Bipolar disorder (HCC)   . Deep vein blood clot of left lower extremity (HCC)   . Family history of breast cancer   . Genetic testing 05/03/2017   Common Cancers panel (47 genes) @ [facility] - No pathogenic mutations detected  . Hypercholesterolemia   . Myalgia   . Seasonal allergies   . Vitamin D deficiency     Past Surgical History:  Procedure Laterality Date  . COLONOSCOPY  02/02/2019   [facility]  . TONSILLECTOMY AND ADENOIDECTOMY      Family History  Problem Relation Age of Onset  . Ovarian cancer Mother        stage 4   . Breast cancer Mother        double mastectomy in 2019  . Prostate cancer Paternal Uncle        deceased 44s  . Breast cancer Maternal Grandmother 45       2nd primary vs. recurrence in 45s  . Breast cancer Other        mother's paternal first cousin; dx 68s  . Breast cancer Other        paternal grandmother's sister; currently 56s  . Prostate cancer Paternal Uncle        deceased 80s  . Breast cancer Paternal Grandmother   . Colon cancer Paternal Grandfather   . Colon cancer Paternal Uncle   . Breast cancer Cousin   . Breast cancer Cousin   . Esophageal cancer Neg Hx     Social History   Tobacco Use  .  Smoking status: Never Smoker  . Smokeless tobacco: Never Used  Substance Use Topics  . Alcohol use: Yes    Comment: occasionally  . Drug use: No    Current Outpatient Medications  Medication Sig Dispense Refill  . ARIPiprazole (ABILIFY) 20 MG tablet Take 20 mg by mouth at bedtime.     Marland Kitchen HYDROcodone-acetaminophen (NORCO) 10-325 MG tablet Take 1 tablet by mouth as needed.    . hydrocortisone (ANUSOL-HC) 2.5 % rectal cream Place 1 application rectally 4 (four) times daily as needed for hemorrhoids or anal itching.    . Melatonin 5 MG TABS Take 5 mg by mouth at bedtime.    . Multiple Vitamin (MULTIVITAMIN WITH MINERALS) TABS tablet Take 1 tablet by mouth daily.    Marland Kitchen ibuprofen  (ADVIL,MOTRIN) 200 MG tablet Take 2 tablets (400 mg total) by mouth every 6 (six) hours as needed for fever, headache, mild pain, moderate pain or cramping. (Patient not taking: Reported on 06/10/2019) 30 tablet 0   No current facility-administered medications for this visit.    Allergies  Allergen Reactions  . Dust Mite Extract Shortness Of Breath    Grass, Cats, weeds (Requires Epipen)  . Pollen Extract Anaphylaxis and Hives    Review of Systems:  Constitutional: Denies fever, chills, diaphoresis, appetite change and fatigue.  HEENT: Denies photophobia, eye pain, redness, hearing loss, ear pain, congestion, sore throat, rhinorrhea, sneezing, mouth sores, neck pain, neck stiffness and tinnitus.   Respiratory: Denies SOB, DOE, cough, chest tightness,  and wheezing.   Cardiovascular: Denies chest pain, palpitations and leg swelling.  Genitourinary: Denies dysuria, urgency, frequency, hematuria, flank pain and difficulty urinating.  Musculoskeletal: Denies myalgias, back pain, joint swelling, arthralgias and gait problem.  Skin: No rash.  Neurological: Denies dizziness, seizures, syncope, weakness, light-headedness, numbness and headaches.  Hematological: Denies adenopathy. Easy bruising, personal or family bleeding history  Psychiatric/Behavioral: Has anxiety or depression     Physical Exam:    BP 128/82   Pulse 76   Temp 98.6 F (37 C)   Ht 5\' 4"  (1.626 m)   Wt 157 lb 4 oz (71.3 kg)   LMP 06/07/2019 (Approximate)   BMI 26.99 kg/m  Wt Readings from Last 3 Encounters:  06/25/19 157 lb 4 oz (71.3 kg)  06/10/19 160 lb (72.6 kg)  12/10/18 150 lb (68 kg)   Constitutional:  Well-developed, in no acute distress. Psychiatric: Normal mood and affect. Behavior is normal. HEENT: Pupils normal.  Conjunctivae are normal. No scleral icterus. Neck supple.  Cardiovascular: Normal rate, regular rhythm. No edema Pulmonary/chest: Effort normal and breath sounds normal. No wheezing, rales or  rhonchi. Abdominal: Soft, nondistended. Nontender. Bowel sounds active throughout. There are no masses palpable. No hepatomegaly. Rectal: Only inspection was performed as it was very painful.  A small ? Anal tag vs sentinel pile was visualized in the posterior rectum.  No perirectal excoriation.  No fistulas. Seen in presence of Brook CMA. Neurological: Alert and oriented to person place and time. Skin: Skin is warm and dry. No rashes noted.  Data Reviewed: I have personally reviewed following labs and imaging studies  CBC: CBC Latest Ref Rng & Units 06/10/2019 12/10/2018 06/03/2016  WBC 4.0 - 10.5 K/uL 4.5 4.3 8.6  Hemoglobin 12.0 - 15.0 g/dL 06/05/2016 19.3 79.0  Hematocrit 36.0 - 46.0 % 43.8 40.3 44.2  Platelets 150 - 400 K/uL 260 201 239    CMP: CMP Latest Ref Rng & Units 06/10/2019 12/10/2018 06/03/2016  Glucose 70 - 99  mg/dL 85 96 115(H)  BUN 6 - 20 mg/dL 13 14 17   Creatinine 0.44 - 1.00 mg/dL 0.90 0.89 0.87  Sodium 135 - 145 mmol/L 142 139 137  Potassium 3.5 - 5.1 mmol/L 3.8 4.3 3.7  Chloride 98 - 111 mmol/L 104 105 101  CO2 22 - 32 mmol/L 28 26 28   Calcium 8.9 - 10.3 mg/dL 9.8 9.3 9.3  Total Protein 6.5 - 8.1 g/dL 8.8(H) - 8.3(H)  Total Bilirubin 0.3 - 1.2 mg/dL 0.9 - 0.6  Alkaline Phos 38 - 126 U/L 51 - 40  AST 15 - 41 U/L 26 - 26  ALT 0 - 44 U/L 26 - 24     Radiology Studies: CT ABDOMEN PELVIS W CONTRAST  Result Date: 06/10/2019 CLINICAL DATA:  Abdominal pain. Difficulty with bowel movements. Rectal pain. EXAM: CT ABDOMEN AND PELVIS WITH CONTRAST TECHNIQUE: Multidetector CT imaging of the abdomen and pelvis was performed using the standard protocol following bolus administration of intravenous contrast. CONTRAST:  142mL OMNIPAQUE IOHEXOL 300 MG/ML  SOLN COMPARISON:  12/10/2018 FINDINGS: Lower chest: Clear lung bases.  Heart normal in size. Hepatobiliary: No focal liver abnormality is seen. No gallstones, gallbladder wall thickening, or biliary dilatation. Pancreas: Unremarkable.  No pancreatic ductal dilatation or surrounding inflammatory changes. Spleen: Normal in size without focal abnormality. Adrenals/Urinary Tract: Adrenal glands are unremarkable. Kidneys are normal, without renal calculi, focal lesion, or hydronephrosis. Bladder is unremarkable. Stomach/Bowel: Stomach is within normal limits. Appendix appears normal. No evidence of bowel wall thickening, distention, or inflammatory changes. Vascular/Lymphatic: No significant vascular findings are present. No enlarged abdominal or pelvic lymph nodes. Reproductive: Uterus and bilateral adnexa are unremarkable. Other: No perirectal or perineal mass, fluid collection or inflammation. No abdominal wall hernia. No ascites. Musculoskeletal: No acute or significant osseous findings. IMPRESSION: 1. Normal exam.  No findings to account for the patient's symptoms. Electronically Signed   By: Lajean Manes M.D.   On: 06/10/2019 13:22   Extensive records were reviewed.   Carmell Austria, MD 06/25/2019, 3:18 PM  Cc: Sharilyn Sites, MD

## 2019-06-26 ENCOUNTER — Telehealth: Payer: Self-pay

## 2019-06-26 NOTE — Telephone Encounter (Signed)
I have made patient an appointment with Dr. Drue Dun for April 13th at 2:30pm. Patient has been added to the cancellation list to see if they could get her in any sooner. Patient is aware.

## 2020-05-10 ENCOUNTER — Emergency Department (HOSPITAL_COMMUNITY)
Admission: EM | Admit: 2020-05-10 | Discharge: 2020-05-10 | Disposition: A | Discharge status: Home / Self Care | Payer: Managed Care, Other (non HMO) | Attending: Emergency Medicine | Admitting: Emergency Medicine

## 2020-05-10 ENCOUNTER — Encounter (HOSPITAL_COMMUNITY): Payer: Self-pay | Admitting: Emergency Medicine

## 2020-05-10 ENCOUNTER — Other Ambulatory Visit: Payer: Self-pay

## 2020-05-10 DIAGNOSIS — Z5321 Procedure and treatment not carried out due to patient leaving prior to being seen by health care provider: Secondary | ICD-10-CM | POA: Insufficient documentation

## 2020-05-10 DIAGNOSIS — M545 Low back pain, unspecified: Secondary | ICD-10-CM | POA: Diagnosis present

## 2020-05-10 LAB — CBC
HCT: 42.9 % (ref 36.0–46.0)
Hemoglobin: 14 g/dL (ref 12.0–15.0)
MCH: 32.5 pg (ref 26.0–34.0)
MCHC: 32.6 g/dL (ref 30.0–36.0)
MCV: 99.5 fL (ref 80.0–100.0)
Platelets: 252 10*3/uL (ref 150–400)
RBC: 4.31 MIL/uL (ref 3.87–5.11)
RDW: 11.2 % — ABNORMAL LOW (ref 11.5–15.5)
WBC: 6.5 10*3/uL (ref 4.0–10.5)
nRBC: 0 % (ref 0.0–0.2)

## 2020-05-10 LAB — BASIC METABOLIC PANEL
Anion gap: 8 (ref 5–15)
BUN: 14 mg/dL (ref 6–20)
CO2: 26 mmol/L (ref 22–32)
Calcium: 9.5 mg/dL (ref 8.9–10.3)
Chloride: 103 mmol/L (ref 98–111)
Creatinine, Ser: 0.83 mg/dL (ref 0.44–1.00)
GFR, Estimated: >60 mL/min (ref >60)
Glucose, Bld: 89 mg/dL (ref 70–99)
Potassium: 4.4 mmol/L (ref 3.5–5.1)
Sodium: 137 mmol/L (ref 135–145)

## 2020-05-10 LAB — PREGNANCY, URINE: Preg Test, Ur: NEGATIVE

## 2020-05-10 NOTE — ED Triage Notes (Signed)
Pt c/o lower back pain with urinary symptoms x I month.

## 2020-05-12 LAB — URINE CULTURE

## 2020-06-03 ENCOUNTER — Emergency Department
Admission: EM | Admit: 2020-06-03 | Discharge: 2020-06-03 | Disposition: A | Discharge status: Home / Self Care | Payer: Managed Care, Other (non HMO) | Attending: Emergency Medicine | Admitting: Emergency Medicine

## 2020-06-03 ENCOUNTER — Other Ambulatory Visit: Payer: Self-pay

## 2020-06-03 ENCOUNTER — Encounter: Payer: Self-pay | Admitting: Emergency Medicine

## 2020-06-03 DIAGNOSIS — W25XXXA Contact with sharp glass, initial encounter: Secondary | ICD-10-CM | POA: Diagnosis not present

## 2020-06-03 DIAGNOSIS — S61210A Laceration without foreign body of right index finger without damage to nail, initial encounter: Secondary | ICD-10-CM | POA: Diagnosis not present

## 2020-06-03 MED ORDER — CEPHALEXIN 500 MG PO CAPS: 500.0000 mg | ORAL_CAPSULE | Freq: Four times a day (QID) | ORAL | 0 refills | Status: AC

## 2020-06-03 NOTE — ED Provider Notes (Signed)
[facility] Emergency Department Provider Note  ____________________________________________   Event Date/Time   First MD Initiated Contact with Patient 06/03/20 0915     (approximate)  I have reviewed the triage vital signs and the nursing notes.   HISTORY  Chief Complaint Laceration  HPI [name] is a 35 y.o. female who presents to the emergency department for evaluation of laceration to the right index finger.  Patient states that she was washing dishes last night when some glass that she was cleaning shattered in her hand, cutting the dorsal side of her proximal right index finger.  She was able to control the bleeding last night, however presented to the emergency department this morning due to bleeding through multiple bandages that she has placed on it today.  Her tetanus is up-to-date.         Past Medical History:  Diagnosis Date  . Anxiety   . Bipolar disorder (HCC)   . Deep vein blood clot of left lower extremity (HCC)   . Family history of breast cancer   . Genetic testing 05/03/2017   Common Cancers panel (47 genes) @ [facility] - No pathogenic mutations detected  . Hypercholesterolemia   . Myalgia   . Seasonal allergies   . Vitamin D deficiency     Patient Active Problem List   Diagnosis Date Noted  . Genetic testing 05/03/2017  . Family history of breast cancer     Past Surgical History:  Procedure Laterality Date  . [facility]  [facility]  . TONSILLECTOMY AND ADENOIDECTOMY      Prior to Admission medications   Medication Sig Start Date End Date Taking? Authorizing Provider  cephALEXin (KEFLEX) 500 MG capsule Take 1 capsule (500 mg total) by mouth 4 (four) times daily for 7 days. 06/03/20 06/10/20 Yes Jaquay Morneault, Ruben Gottronaitlin J, PA  ARIPiprazole (ABILIFY) 20 MG tablet Take 20 mg by mouth at bedtime.     [provider]  HYDROcodone-acetaminophen (NORCO) 10-325 MG tablet Take 1 tablet by  mouth as needed.    [provider]  hydrocortisone (ANUSOL-HC) 2.5 % rectal cream Place 1 application rectally 4 (four) times daily as needed for hemorrhoids or anal itching.    [provider]  ibuprofen (ADVIL,MOTRIN) 200 MG tablet Take 2 tablets (400 mg total) by mouth every 6 (six) hours as needed for fever, headache, mild pain, moderate pain or cramping. Patient not taking: Reported on 06/10/2019 09/09/16   Fayrene Helperran, [place], PA-C  Melatonin 5 MG TABS Take 5 mg by mouth at bedtime.    [provider]  Multiple Vitamin (MULTIVITAMIN WITH MINERALS) TABS tablet Take 1 tablet by mouth daily.    [provider]    Allergies Dust mite extract and Pollen extract  Family History  Problem Relation Age of Onset  . Ovarian cancer Mother        stage 4   . Breast cancer Mother        double mastectomy in 2019  . Prostate cancer Paternal Uncle        deceased 4560s  . Breast cancer Maternal Grandmother 45       2nd primary vs. recurrence in 4350s  . Breast cancer Other        mother's paternal first cousin; dx 5340s  . Breast cancer Other        paternal grandmother's sister; currently 6870s  . Prostate cancer Paternal Uncle  deceased 39s  . Breast cancer Paternal Grandmother   . Colon cancer Paternal Grandfather   . Colon cancer Paternal Uncle   . Breast cancer Cousin   . Breast cancer Cousin   . Esophageal cancer Neg Hx     Social History Social History   Tobacco Use  . Smoking status: Never Smoker  . Smokeless tobacco: Never Used  Vaping Use  . Vaping Use: Never used  Substance Use Topics  . Alcohol use: Yes    Alcohol/week: 2.0 standard drinks    Types: 1 Cans of beer, 1 Glasses of wine per week    Comment: occasionally  . Drug use: No    Review of Systems Constitutional: No fever/chills Eyes: No visual changes. ENT: No sore throat. Cardiovascular: Denies chest pain. Respiratory: Denies shortness of breath. Gastrointestinal: No abdominal  pain.  No nausea, no vomiting.  No diarrhea.  No constipation. Genitourinary: Negative for dysuria. Musculoskeletal: Negative for back pain. Skin: + Laceration, negative for rash. Neurological: Negative for headaches, focal weakness or numbness.   ____________________________________________   PHYSICAL EXAM:  VITAL SIGNS: ED Triage Vitals  Enc Vitals Group     BP 06/03/20 0909 (!) 150/75     Pulse Rate 06/03/20 0909 83     Resp 06/03/20 0909 18     Temp 06/03/20 0909 97.9 F (36.6 C)     Temp Source 06/03/20 0909 Oral     SpO2 06/03/20 0909 99 %     Weight 06/03/20 0910 160 lb (72.6 kg)     Height 06/03/20 0910 5\' 4"  (1.626 m)     Head Circumference --      Peak Flow --      Pain Score 06/03/20 0909 0     Pain Loc --      Pain Edu? --      Excl. in GC? --    Constitutional: Alert and oriented. Well appearing and in no acute distress. Eyes: Conjunctivae are normal. PERRL. EOMI. Head: Atraumatic. Nose: No congestion/rhinnorhea. Mouth/Throat: Mucous membranes are moist.   Neck: No stridor.   Musculoskeletal: Patient has full range of motion of the digits of the right hand without difficulty.  No tenderness to palpation.  Radial pulse 2+.  Capillary refill less than 3 seconds. Neurologic:  Normal speech and language. No gross focal neurologic deficits are appreciated. No gait instability. Skin: There are 2 lacerations to the dorsal surface of the proximal right index finger.  1 laceration measures approximately 1 cm and is superficial in nature with edges not well approximated.  The other laceration is approximately 0.5 cm and lies on the dorsal surface of the right second MCP.  The edges of this well approximated as well with minimal bleeding. Psychiatric: Mood and affect are normal. Speech and behavior are normal.  ____________________________________________   PROCEDURES  Procedure(s) performed (including Critical Care):  01/23/22Marland KitchenLaceration Repair  Date/Time: 06/03/2020  4:14 PM Performed by: 06/05/2020, PA Authorized by: Lucy Chris, PA   Consent:    Consent obtained:  Verbal   Consent given by:  Patient   Risks, benefits, and alternatives were discussed: yes     Risks discussed:  Infection, pain, poor cosmetic result and need for additional repair   Alternatives discussed:  No treatment Universal protocol:    Procedure explained and questions answered to patient or proxy's satisfaction: yes     Patient identity confirmed:  Verbally with patient Anesthesia:    Anesthesia method:  None Laceration details:  Location:  Finger   Finger location:  R index finger   Length (cm):  1   Depth (mm):  2 Pre-procedure details:    Preparation:  Patient was prepped and draped in usual sterile fashion Exploration:    Hemostasis achieved with:  Direct pressure   Wound exploration: wound explored through full range of motion and entire depth of wound visualized     Contaminated: no   Treatment:    Area cleansed with:  Chlorhexidine   Amount of cleaning:  Standard   Irrigation method:  Tap   Debridement:  None Skin repair:    Repair method:  Tissue adhesive Approximation:    Approximation:  Close Repair type:    Repair type:  Simple Post-procedure details:    Dressing:  Open (no dressing)   Procedure completion:  Tolerated well, no immediate complications .Marland KitchenLaceration Repair  Date/Time: 06/03/2020 4:15 PM Performed by: Lucy Chris, PA Authorized by: Lucy Chris, PA   Consent:    Consent obtained:  Verbal   Consent given by:  Patient   Risks, benefits, and alternatives were discussed: yes     Risks discussed:  Infection, pain and poor cosmetic result   Alternatives discussed:  No treatment Universal protocol:    Procedure explained and questions answered to patient or proxy's satisfaction: yes     Patient identity confirmed:  Verbally with patient Anesthesia:    Anesthesia method:  None Laceration details:     Location:  Finger   Finger location:  R index finger Pre-procedure details:    Preparation:  Patient was prepped and draped in usual sterile fashion Exploration:    Limited defect created (wound extended): no     Hemostasis achieved with:  Direct pressure Treatment:    Area cleansed with:  Chlorhexidine   Amount of cleaning:  Standard   Irrigation method:  Tap   Debridement:  None   Undermining:  None Skin repair:    Repair method:  Tissue adhesive Approximation:    Approximation:  Close Repair type:    Repair type:  Simple Post-procedure details:    Procedure completion:  Tolerated well, no immediate complications     ____________________________________________   INITIAL IMPRESSION / ASSESSMENT AND PLAN / ED COURSE  As part of my medical decision making, I reviewed the following data within the electronic MEDICAL RECORD NUMBER Nursing notes reviewed and incorporated        Patient is a 35 year old female who presents to the emergency department for evaluation of lacerations to the right index finger following washing glass that broke last night.  See HPI for further details.  Physical exam, the patient does have 2 lacerations that approximated very well and are superficial with minimal bleeding.  Options discussed include suture repair, tissue adhesive or Steri-Strips.  Patient elected for tissue adhesive repair.  Patient's tetanus is up-to-date.  Will be placed on Keflex for prophylaxis against infection.  Patient is amenable to this plan.  She will seek repeat medical evaluation if she develops any symptoms of infection.  Patient is stable this time for outpatient therapy.      ____________________________________________   FINAL CLINICAL IMPRESSION(S) / ED DIAGNOSES  Final diagnoses:  Laceration of right index finger without foreign body without damage to nail, initial encounter     ED Discharge Orders         Ordered    cephALEXin (KEFLEX) 500 MG capsule  4 times  daily  06/03/20 0931          *Please note:  Corey Harold Ciresi was evaluated in Emergency Department on 06/03/2020 for the symptoms described in the history of present illness. She was evaluated in the context of the global COVID-19 pandemic, which necessitated consideration that the patient might be at risk for infection with the SARS-CoV-2 virus that causes COVID-19. Institutional protocols and algorithms that pertain to the evaluation of patients at risk for COVID-19 are in a state of rapid change based on information released by regulatory bodies including the CDC and federal and state organizations. These policies and algorithms were followed during the patient's care in the ED.  Some ED evaluations and interventions may be delayed as a result of limited staffing during and the pandemic.*   Note:  This document was prepared using Dragon voice recognition software and may include unintentional dictation errors.   Lucy Chris, PA 06/03/20 1617    Dionne Bucy, MD 06/04/20 1525

## 2020-06-03 NOTE — Discharge Instructions (Signed)
Please take the antibiotic as prescribed. If you develop any redness, heat, drainage or fever, please follow up with your primary care.

## 2020-06-03 NOTE — ED Triage Notes (Signed)
Presents with laceration to right index finger  States cut finger with glass last pm around 7 pm

## 2020-06-10 ENCOUNTER — Encounter: Payer: Self-pay | Admitting: Genetic Counselor

## 2020-06-10 NOTE — Progress Notes (Signed)
UPDATE:  The VUS in [place] has been reclassified to Likely benign.  The updated report date is June 10, 2020.

## 2020-08-12 ENCOUNTER — Ambulatory Visit: Payer: Managed Care, Other (non HMO) | Admitting: Family Medicine

## 2020-09-17 ENCOUNTER — Encounter (HOSPITAL_BASED_OUTPATIENT_CLINIC_OR_DEPARTMENT_OTHER): Payer: Self-pay

## 2020-09-17 ENCOUNTER — Emergency Department (HOSPITAL_BASED_OUTPATIENT_CLINIC_OR_DEPARTMENT_OTHER)
Admission: EM | Admit: 2020-09-17 | Discharge: 2020-09-17 | Disposition: A | Discharge status: Home / Self Care | Payer: PRIVATE HEALTH INSURANCE | Attending: Emergency Medicine | Admitting: Emergency Medicine

## 2020-09-17 ENCOUNTER — Other Ambulatory Visit: Payer: Self-pay

## 2020-09-17 DIAGNOSIS — I10 Essential (primary) hypertension: Secondary | ICD-10-CM

## 2020-09-17 DIAGNOSIS — O26891 Other specified pregnancy related conditions, first trimester: Secondary | ICD-10-CM | POA: Diagnosis not present

## 2020-09-17 DIAGNOSIS — M545 Low back pain, unspecified: Secondary | ICD-10-CM | POA: Insufficient documentation

## 2020-09-17 DIAGNOSIS — Z3491 Encounter for supervision of normal pregnancy, unspecified, first trimester: Secondary | ICD-10-CM

## 2020-09-17 DIAGNOSIS — Z3A01 Less than 8 weeks gestation of pregnancy: Secondary | ICD-10-CM | POA: Diagnosis not present

## 2020-09-17 DIAGNOSIS — O131 Gestational [pregnancy-induced] hypertension without significant proteinuria, first trimester: Secondary | ICD-10-CM | POA: Insufficient documentation

## 2020-09-17 LAB — URINALYSIS, ROUTINE W REFLEX MICROSCOPIC
Bilirubin Urine: NEGATIVE
Glucose, UA: NEGATIVE mg/dL
Hgb urine dipstick: NEGATIVE
Ketones, ur: NEGATIVE mg/dL
Leukocytes,Ua: NEGATIVE
Nitrite: NEGATIVE
Protein, ur: NEGATIVE mg/dL
Specific Gravity, Urine: <1.005 — ABNORMAL LOW (ref 1.005–1.030)
pH: 6 (ref 5.0–8.0)

## 2020-09-17 NOTE — ED Provider Notes (Signed)
[facility] EMERGENCY DEPT Provider Note   CSN: [id number] Arrival date & time: 09/17/20  1220     History Chief Complaint  Patient presents with  . Back Pain    [name] is a 35 y.o. female.  [name] states that she developed lower back pain about 2 days ago.  She is [redacted] weeks pregnant, and she has been to [facility] once.  She had a quantitative hCG and has an ultrasound scheduled in about 2 days.  She was having back pain during her appointment yesterday, but she did not think it was relevant.  However, the pain has persisted, and she is concerned.  This is her first pregnancy, she is wanting to make sure that everything is fine.  The history is provided by the patient.  Back Pain Location:  Lumbar spine Quality:  Aching Radiates to:  Does not radiate Pain severity:  Moderate Pain is:  Same all the time Onset quality:  Sudden Duration:  2 days Timing:  Constant Progression:  Unchanged Chronicity:  New Context: not falling, not lifting heavy objects, not recent illness, not recent injury and not twisting   Relieved by:  Being still Worsened by:  Movement and bending Ineffective treatments:  None tried Associated symptoms: no abdominal pain, no bladder incontinence, no bowel incontinence, no chest pain, no dysuria, no fever, no leg pain, no numbness, no paresthesias and no weakness   Risk factors: pregnancy        Past Medical History:  Diagnosis Date  . Anxiety   . Bipolar disorder (HCC)   . Deep vein blood clot of left lower extremity (HCC)   . Family history of breast cancer   . Genetic testing 05/03/2017   Common Cancers panel (47 genes) @ [facility] - No pathogenic mutations detected  . Hypercholesterolemia   . Myalgia   . Seasonal allergies   . Vitamin D deficiency     Patient Active Problem List   Diagnosis Date Noted  . Genetic testing 05/03/2017  . Family history of breast cancer     Past Surgical History:   Procedure Laterality Date  . COLONOSCOPY  02/02/2019   [facility]  . TONSILLECTOMY AND ADENOIDECTOMY       OB History    Gravida  1   Para      Term      Preterm      AB      Living        SAB      IAB      Ectopic      Multiple      Live Births              Family History  Problem Relation Age of Onset  . Ovarian cancer Mother        stage 4   . Breast cancer Mother        double mastectomy in 2019  . Prostate cancer Paternal Uncle        deceased 24s  . Breast cancer Maternal Grandmother 45       2nd primary vs. recurrence in 17s  . Breast cancer Other        mother's paternal first cousin; dx 67s  . Breast cancer Other        paternal grandmother's sister; currently 41s  . Prostate cancer Paternal Uncle        deceased 66s  . Breast cancer Paternal Grandmother   .  Colon cancer Paternal Grandfather   . Colon cancer Paternal Uncle   . Breast cancer Cousin   . Breast cancer Cousin   . Esophageal cancer Neg Hx     Social History   Tobacco Use  . Smoking status: Never Smoker  . Smokeless tobacco: Never Used  Vaping Use  . Vaping Use: Never used  Substance Use Topics  . Alcohol use: Yes    Alcohol/week: 2.0 standard drinks    Types: 1 Cans of beer, 1 Glasses of wine per week    Comment: occasionally  . Drug use: No    Home Medications Prior to Admission medications   Medication Sig Start Date End Date Taking? Authorizing Provider  ARIPiprazole (ABILIFY) 20 MG tablet Take 20 mg by mouth at bedtime.     [provider]  HYDROcodone-acetaminophen (NORCO) 10-325 MG tablet Take 1 tablet by mouth as needed.    [provider]  hydrocortisone (ANUSOL-HC) 2.5 % rectal cream Place 1 application rectally 4 (four) times daily as needed for hemorrhoids or anal itching.    [provider]  ibuprofen (ADVIL,MOTRIN) 200 MG tablet Take 2 tablets (400 mg total) by mouth every 6 (six) hours as needed for fever,  headache, mild pain, moderate pain or cramping. Patient not taking: Reported on 06/10/2019 09/09/16   Fayrene Helper, PA-C  Melatonin 5 MG TABS Take 5 mg by mouth at bedtime.    [provider]  Multiple Vitamin (MULTIVITAMIN WITH MINERALS) TABS tablet Take 1 tablet by mouth daily.    [provider]    Allergies    Dust mite extract and Pollen extract  Review of Systems   Review of Systems  Constitutional: Negative for chills and fever.  HENT: Negative for ear pain and sore throat.   Eyes: Negative for pain and visual disturbance.  Respiratory: Negative for cough and shortness of breath.   Cardiovascular: Negative for chest pain and palpitations.  Gastrointestinal: Negative for abdominal pain, bowel incontinence and vomiting.  Genitourinary: Negative for bladder incontinence, dysuria, hematuria and vaginal bleeding.  Musculoskeletal: Positive for back pain. Negative for arthralgias.  Skin: Negative for color change and rash.  Neurological: Negative for seizures, syncope, weakness, numbness and paresthesias.  All other systems reviewed and are negative.   Physical Exam Updated Vital Signs BP (!) 162/96 (BP Location: Right Arm)   Pulse (!) 106   Temp 98.5 F (36.9 C) (Oral)   Resp 14   Ht 5\' 4"  (1.626 m)   Wt 72.6 kg   LMP  (LMP Unknown)   SpO2 98%   BMI 27.46 kg/m   Physical Exam Vitals and nursing note reviewed.  HENT:     Head: Normocephalic and atraumatic.  Eyes:     General: No scleral icterus. Pulmonary:     Effort: Pulmonary effort is normal. No respiratory distress.  Abdominal:     General: There is no distension.     Palpations: Abdomen is soft.     Tenderness: There is no abdominal tenderness.  Musculoskeletal:     Cervical back: Normal range of motion.       Back:     Comments: Mild, diffuse tenderness about the low back that is worse with forward flexion and especially lumbar extension.  Pain is also worse with changing positions from  supine to standing.  Skin:    General: Skin is warm and dry.  Neurological:     Mental Status: She is alert.  Psychiatric:  Mood and Affect: Mood normal.     ED Results / Procedures / Treatments   Labs (all labs ordered are listed, but only abnormal results are displayed) Labs Reviewed  URINALYSIS, ROUTINE W REFLEX MICROSCOPIC - Abnormal; Notable for the following components:      Result Value   Color, Urine COLORLESS (*)    Specific Gravity, Urine <1.005 (*)    All other components within normal limits    EKG None  Radiology No results found.  Procedures Procedures   Medications Ordered in ED Medications - No data to display  ED Course  I have reviewed the triage vital signs and the nursing notes.  Pertinent labs & imaging results that were available during my care of the patient were reviewed by me and considered in my medical decision making (see chart for details).    MDM Rules/Calculators/A&P                          Cedar Barkley Bruns Casali is very well-appearing.  Back pain seems to be related to a musculoskeletal cause with pain worse with movement.  No red flag symptoms for ectopic pregnancy or pregnancy complication.  Pregnancy localization and imaging deferred as a result.  I did talk to her about symptomatic management including safe medications during pregnancy.  We also discussed her mild hypertension, and I recommended that she keep her OB/GYN follow-up. Final Clinical Impression(s) / ED Diagnoses Final diagnoses:  First trimester pregnancy  Acute bilateral low back pain without sciatica  Hypertension, unspecified type    Rx / DC Orders ED Discharge Orders    None       Koleen Distance, MD 09/17/20 1316

## 2020-09-17 NOTE — Discharge Instructions (Signed)
Over-The-Counter Medications Allowed During Pregnancy and Lactation  Acne All OTC acne products/medications may be used.  Antihistamines Actifed Allegra Benadryl Claritin Claritin-D Chlor-Trimeton-D Chlor-Trimeton-DM Sudafed* Tylenol Allergy Zyrtec  Cough Suppressants Robitussin Robitussin DM Robitussin PE** **DO NOT USE if taking Sudafed  Calcium Supplements Any calcium supplement may be used, including: Tums EX - 2 tablets twice daily Viactiv  Constipation Benefiber Colace Fibercon Metamucil Milk of Magnesia Unifiber  Decongestants Sudafed Sudafed Sinus Sudafed Non-Drying Actifed Tylenol Sinus Benadryl Doxylamine Succinate  Dry Skin Cocoa Butter Eucerin Lotion Vitamin E Lotion  Expectorants Mucinex Robitussin  Fever Tylenol Tylenol Extra Strength (2 tablets every 6 hours)  Gas Mylicon Mylanta GAS Mylanta Antacid/Anti-Gas Phazyme  Hemorrhoids Preparation H Colace Annusol   Heartburn Gaviscon Maalox Mylanta Nexium Pepcid Complete Prevacid Prilosec Rolaids Tums Zantac  Iron Supplements Any iron supplement may be taken, including: Ferro-Sequels Ferancee HP Slow-Fe Slow-Fe with Folic Acid  Insect Repellant Any insect repellant is safe to use.  Itch Benadryl Ointment Caladaryl Lotion Hyrdocortisone Anti-Itch Ointment Cortaid  Nasal Spray Afrin - DO NOT for more than 3 days. Flonase Nasonex Ocean and Nasal Mist - may use as needed  Nausea Emetrol Relief Band Sea Bands Vitamin B6 (25mg  3 times daily Unisom (1 Doxylamine 12.5mg  tablet daily) with Vitamin B6  Pain Tylenol Tylenol Extra Strength  Poison Ivy Nutech Wash Caladryl Lotion* Ivy Dry* *Clean affected area with soap and then apply medication to prevent the spread of poison ivy.  Diarrhea Imodium AD  Sore Throat Chloraseptic Oral Strips Chloraseptic Spray Sucrets or other throat lozenges  Sleep DO NOT USE ON A DAILY BASIS! Benadryl  25mg  Tylenol PM Unisom  Sunscreen Use SPF 30 or higher  Yeast Infection Any OTC medication may be used, including: Femstat 3 Gynelotrimin Cream Monistat 3* Monistat 7* *Insert the applicator halfway only.  Miscellaneous Concerns No studies have been performed indicating that the following procedures have an adverse effect on fetal development: Teeth Whitening Artificial Nails Hair Coloring (after the 1st trimester-12 weeks)

## 2020-09-17 NOTE — ED Triage Notes (Addendum)
Patient having lower back pain starting 2 days ago when patient first found out she was pregnant.  Patient states [redacted] weeks pregnant was estimated from quantitative blood sample, no ultrasound yet.  Denies any vaginal bleeding or abdominal pain.

## 2020-10-03 ENCOUNTER — Emergency Department (HOSPITAL_BASED_OUTPATIENT_CLINIC_OR_DEPARTMENT_OTHER)
Admission: EM | Admit: 2020-10-03 | Discharge: 2020-10-04 | Discharge status: Against Medical Advice | Payer: PRIVATE HEALTH INSURANCE | Attending: Emergency Medicine | Admitting: Emergency Medicine

## 2020-10-03 ENCOUNTER — Encounter (HOSPITAL_BASED_OUTPATIENT_CLINIC_OR_DEPARTMENT_OTHER): Payer: Self-pay | Admitting: Obstetrics and Gynecology

## 2020-10-03 ENCOUNTER — Other Ambulatory Visit: Payer: Self-pay

## 2020-10-03 DIAGNOSIS — Z5321 Procedure and treatment not carried out due to patient leaving prior to being seen by health care provider: Secondary | ICD-10-CM | POA: Diagnosis not present

## 2020-10-03 DIAGNOSIS — O219 Vomiting of pregnancy, unspecified: Secondary | ICD-10-CM | POA: Diagnosis not present

## 2020-10-03 DIAGNOSIS — Z3A08 8 weeks gestation of pregnancy: Secondary | ICD-10-CM | POA: Insufficient documentation

## 2020-10-03 NOTE — ED Triage Notes (Signed)
Patient reports to the ER for emesis during pregnancy. Patient reports she is [redacted] weeks pregnant and is concerned about dehydration. Patient sees [facility] for ongoing care

## 2020-10-04 NOTE — ED Notes (Signed)
Patient called several times to be brought into Examination Room with No Response. Patient to be discharged as Elopement due to not advising staff of Elopement.

## 2021-01-12 DIAGNOSIS — Z419 Encounter for procedure for purposes other than remedying health state, unspecified: Secondary | ICD-10-CM | POA: Diagnosis not present

## 2021-02-11 DIAGNOSIS — Z419 Encounter for procedure for purposes other than remedying health state, unspecified: Secondary | ICD-10-CM | POA: Diagnosis not present

## 2021-02-12 ENCOUNTER — Other Ambulatory Visit: Payer: Self-pay

## 2021-02-12 ENCOUNTER — Inpatient Hospital Stay (HOSPITAL_COMMUNITY): Payer: No Typology Code available for payment source

## 2021-02-12 ENCOUNTER — Inpatient Hospital Stay (HOSPITAL_COMMUNITY)
Admission: AD | Admit: 2021-02-12 | Discharge: 2021-02-12 | Disposition: A | Discharge status: Home / Self Care | Payer: No Typology Code available for payment source | Attending: Obstetrics and Gynecology | Admitting: Obstetrics and Gynecology

## 2021-02-12 ENCOUNTER — Encounter (HOSPITAL_COMMUNITY): Payer: Self-pay | Admitting: Obstetrics and Gynecology

## 2021-02-12 DIAGNOSIS — R1011 Right upper quadrant pain: Secondary | ICD-10-CM | POA: Insufficient documentation

## 2021-02-12 DIAGNOSIS — O26892 Other specified pregnancy related conditions, second trimester: Secondary | ICD-10-CM | POA: Diagnosis not present

## 2021-02-12 DIAGNOSIS — R109 Unspecified abdominal pain: Secondary | ICD-10-CM | POA: Diagnosis not present

## 2021-02-12 DIAGNOSIS — Z3A26 26 weeks gestation of pregnancy: Secondary | ICD-10-CM

## 2021-02-12 LAB — URINALYSIS, ROUTINE W REFLEX MICROSCOPIC
Bilirubin Urine: NEGATIVE
Glucose, UA: NEGATIVE mg/dL
Hgb urine dipstick: NEGATIVE
Ketones, ur: NEGATIVE mg/dL
Leukocytes,Ua: NEGATIVE
Nitrite: NEGATIVE
Protein, ur: NEGATIVE mg/dL
Specific Gravity, Urine: 1.015 (ref 1.005–1.030)
pH: 8 (ref 5.0–8.0)

## 2021-02-12 LAB — COMPREHENSIVE METABOLIC PANEL
ALT: 15 U/L (ref 0–44)
AST: 21 U/L (ref 15–41)
Albumin: 3.1 g/dL — ABNORMAL LOW (ref 3.5–5.0)
Alkaline Phosphatase: 58 U/L (ref 38–126)
Anion gap: 9 (ref 5–15)
BUN: 7 mg/dL (ref 6–20)
CO2: 19 mmol/L — ABNORMAL LOW (ref 22–32)
Calcium: 8.9 mg/dL (ref 8.9–10.3)
Chloride: 106 mmol/L (ref 98–111)
Creatinine, Ser: 0.65 mg/dL (ref 0.44–1.00)
GFR, Estimated: >60 mL/min (ref >60)
Glucose, Bld: 114 mg/dL — ABNORMAL HIGH (ref 70–99)
Potassium: 3.4 mmol/L — ABNORMAL LOW (ref 3.5–5.1)
Sodium: 134 mmol/L — ABNORMAL LOW (ref 135–145)
Total Bilirubin: 0.5 mg/dL (ref 0.3–1.2)
Total Protein: 6.5 g/dL (ref 6.5–8.1)

## 2021-02-12 LAB — CBC
HCT: 36.2 % (ref 36.0–46.0)
Hemoglobin: 12.3 g/dL (ref 12.0–15.0)
MCH: 32.3 pg (ref 26.0–34.0)
MCHC: 34 g/dL (ref 30.0–36.0)
MCV: 95 fL (ref 80.0–100.0)
Platelets: 238 10*3/uL (ref 150–400)
RBC: 3.81 MIL/uL — ABNORMAL LOW (ref 3.87–5.11)
RDW: 11.8 % (ref 11.5–15.5)
WBC: 8.8 10*3/uL (ref 4.0–10.5)
nRBC: 0 % (ref 0.0–0.2)

## 2021-02-12 LAB — LIPASE, BLOOD: Lipase: 34 U/L (ref 11–51)

## 2021-02-12 LAB — PROTEIN / CREATININE RATIO, URINE
Creatinine, Urine: 91.81 mg/dL
Protein Creatinine Ratio: 0.09 mg/mg{Cre} (ref 0.00–0.15)
Total Protein, Urine: 8 mg/dL

## 2021-02-12 NOTE — Discharge Instructions (Signed)
[facility]   [facility]             [address], [place], [place] [address] [phone]  [facility]                                                             [address], [address], [place], [place], [address] [phone]  [facility]                                    [address], [address], [place], [place], [address] [phone]  [facility] [place] [address], [address], [place], [place], [address] [phone]  [facility]                                 [address], [place], [place], [address] [phone]  [facility]                                    [address], [place], [place], [address] [phone]  [facility] [address], [address], [place], [place], [address]                              [facility] [address], [place], [place], [address] [phone]  [facility]         Phone: [phone]  [facility]      Phone: [phone]   [facility]      Phone: [phone]  [facility]         Phone: [phone]   [facility]    Phone: [phone]  [facility]      Phone: [phone]  [facility]     Phone: [phone]  [facility]        Phone: [phone]  [facility]      Phone: [phone]  

## 2021-02-12 NOTE — MAU Note (Signed)
...  [name] is a 35 y.o. at [redacted]w[redacted]d here in MAU reporting: RUQ pain that has been occurring for over a month now. She states it gets worse when she eats and does not always hurt. She states she was able to eat dinner last night, which was a sandwich. She states she seems to have the pain more when she eats "heavier and greasier" foods. She states she tried to eat yogurt this morning and the pain was 10/10 and she threw it up. Her pain right now is 8/10. Endorses nausea. +FM. Denies VB or LOF but endorses an increase in white, thick discharge that is malodorous.   Receives OB care with [facility]. She states she did not think she could make it to [place] so she came here.   Note encounter on 02/06/2021 with [facility]. Patient endorsed RUQ that had been occurring for over a month. Advised to be seen but did not go that day.  Patient states she is a hard stick and the IV team usually has to start her IV's/draw blood.  Lab orders placed from triage: UA

## 2021-02-12 NOTE — MAU Provider Note (Addendum)
Chief Complaint:  Abdominal Pain    HPI: [name] is a 35 y.o. G1P0 at [redacted]w[redacted]d who presents to maternity admissions reporting abdominal pain. Patient reports ongoing RUQ pain for the past month that has progressively gotten worse. Pain occurs after eating and resolves "once my stomach is empty". She was able to tolerate water, ginger tea and half a bagel this morning, but did throw up yogurt around 0800. She rates pain 8/10. She has been evaluated for this at her OB, but has not had any imaging. Reports they have told her it was her gallbladder. She denies headache, vision changes, contractions, leaking fluid, or vaginal bleeding. Endorses active fetal movement. Gets [facility], but did not go there because she was unsure she could make it there due to pain.   Pregnancy Course:   Past Medical History:  Diagnosis Date   Anxiety    Bipolar disorder (HCC)    Deep vein blood clot of left lower extremity (HCC)    Family history of breast cancer    Genetic testing 05/03/2017   Common Cancers panel (47 genes) @ [facility] - No pathogenic mutations detected   Hypercholesterolemia    Myalgia    Seasonal allergies    Vitamin D deficiency    OB History  Gravida Para Term Preterm AB Living  1            SAB IAB Ectopic Multiple Live Births               # Outcome Date GA Lbr Len/2nd Weight Sex Delivery Anes PTL Lv  1 Current            Past Surgical History:  Procedure Laterality Date   COLONOSCOPY  02/02/2019   [facility]   TONSILLECTOMY AND ADENOIDECTOMY     Family History  Problem Relation Age of Onset   Cancer Mother    Ovarian cancer Mother        stage 4    Breast cancer Mother        double mastectomy in 2019   Prostate cancer Paternal Uncle        deceased 21s   Prostate cancer Paternal Uncle        deceased 65s   Colon cancer Paternal Uncle    Breast cancer Maternal Grandmother 45       2nd primary vs. recurrence in 29s   Breast cancer Paternal  Grandmother    Colon cancer Paternal Grandfather    Breast cancer Cousin    Breast cancer Cousin    Breast cancer Other        mother's paternal first cousin; dx 25s   Breast cancer Other        paternal grandmother's sister; currently 18s   Esophageal cancer Neg Hx    Social History   Tobacco Use   Smoking status: Never   Smokeless tobacco: Never  Vaping Use   Vaping Use: Never used  Substance Use Topics   Alcohol use: Yes    Alcohol/week: 2.0 standard drinks    Types: 1 Glasses of wine, 1 Cans of beer per week    Comment: occasionally   Drug use: No   Allergies  Allergen Reactions   Dust Mite Extract Shortness Of Breath    Grass, Cats, weeds (Requires Epipen)   Pollen Extract Anaphylaxis and Hives   No medications prior to admission.    I have reviewed patient's Past Medical Hx, Surgical Hx, Family Hx, Social  Hx, medications and allergies.   ROS:  Review of Systems  Constitutional: Negative.   Respiratory: Negative.    Cardiovascular: Negative.   Gastrointestinal:  Positive for abdominal pain (RUQ pain), nausea and vomiting.  Genitourinary: Negative.   Musculoskeletal: Negative.   Neurological: Negative.   Psychiatric/Behavioral: Negative.     Physical Exam  Patient Vitals for the past 24 hrs:  BP Temp Temp src Pulse Resp SpO2  02/12/21 1450 128/78 -- -- 97 15 99 %  02/12/21 1401 137/63 -- -- (!) 102 -- 98 %  02/12/21 1346 (!) 151/76 -- -- (!) 111 -- 98 %  02/12/21 1330 (!) 142/69 -- -- (!) 114 -- --  02/12/21 1315 (!) 145/80 -- -- (!) 113 -- --  02/12/21 1239 136/80 -- -- (!) 108 -- --  02/12/21 1200 139/86 -- -- (!) 114 -- --  02/12/21 1145 (!) 151/86 -- -- (!) 125 -- --  02/12/21 1130 (!) 148/77 -- -- (!) 111 -- --  02/12/21 1116 (!) 151/85 -- -- (!) 110 -- --  02/12/21 1112 (!) 152/86 98 F (36.7 C) Oral (!) 103 -- 100 %   Constitutional: well-developed, well-nourished female in no acute distress.  Cardiovascular: normal rate Respiratory: normal  effort GI: abd soft, positive Murphy sign, gravid appropriate for gestational age MS: extremities nontender, no edema, normal ROM Neurologic: alert and oriented x 4.  GU: neg CVAT. Pelvic: deferred   FHT: Baseline 145 bpm, moderate variability, 15x15 accelerations present, no decelerations Toco: quiet   Labs: Results for orders placed or performed during the hospital encounter of 02/12/21 (from the past 24 hour(s))  Urinalysis, Routine w reflex microscopic Urine, Clean Catch     Status: Abnormal   Collection Time: 02/12/21 11:00 AM  Result Value Ref Range   Color, Urine YELLOW YELLOW   APPearance HAZY (A) CLEAR   Specific Gravity, Urine 1.015 1.005 - 1.030   pH 8.0 5.0 - 8.0   Glucose, UA NEGATIVE NEGATIVE mg/dL   Hgb urine dipstick NEGATIVE NEGATIVE   Bilirubin Urine NEGATIVE NEGATIVE   Ketones, ur NEGATIVE NEGATIVE mg/dL   Protein, ur NEGATIVE NEGATIVE mg/dL   Nitrite NEGATIVE NEGATIVE   Leukocytes,Ua NEGATIVE NEGATIVE   RBC / HPF 0-5 0 - 5 RBC/hpf   WBC, UA 0-5 0 - 5 WBC/hpf   Bacteria, UA RARE (A) NONE SEEN   Squamous Epithelial / LPF 21-50 0 - 5   Mucus PRESENT   Protein / creatinine ratio, urine     Status: None   Collection Time: 02/12/21 11:00 AM  Result Value Ref Range   Creatinine, Urine 91.81 mg/dL   Total Protein, Urine 8 mg/dL   Protein Creatinine Ratio 0.09 0.00 - 0.15 mg/mg[Cre]  CBC     Status: Abnormal   Collection Time: 02/12/21 11:30 AM  Result Value Ref Range   WBC 8.8 4.0 - 10.5 K/uL   RBC 3.81 (L) 3.87 - 5.11 MIL/uL   Hemoglobin 12.3 12.0 - 15.0 g/dL   HCT 19.1 47.8 - 29.5 %   MCV 95.0 80.0 - 100.0 fL   MCH 32.3 26.0 - 34.0 pg   MCHC 34.0 30.0 - 36.0 g/dL   RDW 62.1 30.8 - 65.7 %   Platelets 238 150 - 400 K/uL   nRBC 0.0 0.0 - 0.2 %  Comprehensive metabolic panel     Status: Abnormal   Collection Time: 02/12/21 11:30 AM  Result Value Ref Range   Sodium 134 (L) 135 - 145 mmol/L  Potassium 3.4 (L) 3.5 - 5.1 mmol/L   Chloride 106 98 - 111  mmol/L   CO2 19 (L) 22 - 32 mmol/L   Glucose, Bld 114 (H) 70 - 99 mg/dL   BUN 7 6 - 20 mg/dL   Creatinine, Ser 5.78 0.44 - 1.00 mg/dL   Calcium 8.9 8.9 - 46.9 mg/dL   Total Protein 6.5 6.5 - 8.1 g/dL   Albumin 3.1 (L) 3.5 - 5.0 g/dL   AST 21 15 - 41 U/L   ALT 15 0 - 44 U/L   Alkaline Phosphatase 58 38 - 126 U/L   Total Bilirubin 0.5 0.3 - 1.2 mg/dL   GFR, Estimated >62 >95 mL/min   Anion gap 9 5 - 15  Lipase, blood     Status: None   Collection Time: 02/12/21 11:30 AM  Result Value Ref Range   Lipase 34 11 - 51 U/L    Imaging:  US Abdomen Limited RUQ (LIVER/GB)  Result Date: 02/12/2021 CLINICAL DATA:  Abdominal pain EXAM: ULTRASOUND ABDOMEN LIMITED RIGHT UPPER QUADRANT COMPARISON:  None. FINDINGS: Gallbladder: No wall thickening, pericholecystic fluid, stones, or sludge. A questionable Murphy's sign was reported. Common bile duct: Diameter: 2.8 mm Liver: No focal lesion identified. Within normal limits in parenchymal echogenicity. Portal vein is patent on color Doppler imaging with normal direction of blood flow towards the liver. Other: None. IMPRESSION: 1. The gallbladder is normal in appearance. However, a questionable Murphy's sign was reported. If the clinical picture remains ambiguous, a HIDA scan could further evaluate. 2. No other abnormalities. Electronically Signed   By: Gerome Sam III M.D.   On: 02/12/2021 12:57    MAU Course: Orders Placed This Encounter  Procedures   US Abdomen Limited RUQ (LIVER/GB)   Urinalysis, Routine w reflex microscopic Urine, Clean Catch   CBC   Comprehensive metabolic panel   Protein / creatinine ratio, urine   Lipase, blood   Discharge patient   No orders of the defined types were placed in this encounter.   MDM: CBC, CMP, Lipase, UPCR all wnl BP's mild range, remains asymptomatic. Reviewed prenatal records from outside facility. BP noted to be 172/81 at 18 weeks. Patient likely has cHTN, however I see no diagnosis in records. She  is not on medications for her BP.  RUQ ultrasound unremarkable At 1325, patient called out to inform RN of ongoing SOB that worsens with talking and walking. Lung sounds clear bilaterally, O2 sat 100%. Patient is not currently having SOB. Reassured patient that some SOB is common in 2nd/3rd trimester of pregnancy given gravid uterus, however offered chest x-ray for reassurance. Patient declines.  On reassessment, patient reports pain has resolved now that her stomach is empty.   Assessment: 1. Abdominal pain, RUQ     Plan: Discharge home in stable condition Strict return precautions reviewed Recommend gallbladder diet (I.e. greasy, fatty, spicy foods)  Keep appointment as scheduled at [facility] on 10/14  Labor precautions and fetal kick counts reviewed    Follow-up Information     Center, [facility] Follow up.   Specialty: Rehabilitation Why: as scheduled for OB appointment on 10/14. Return to MAU as needed. Contact information: [address] [place] [address] [phone]                 Allergies as of 02/12/2021       Reactions   Dust Mite Extract Shortness Of Breath   Grass, Cats, weeds (Requires Epipen)   Pollen  Extract Anaphylaxis, Hives        Medication List     TAKE these medications    Anusol-HC 2.5 % rectal cream Generic drug: hydrocortisone Place 1 application rectally 4 (four) times daily as needed for hemorrhoids or anal itching.   ARIPiprazole 20 MG tablet Commonly known as: ABILIFY Take 20 mg by mouth at bedtime.   HYDROcodone-acetaminophen 10-325 MG tablet Commonly known as: NORCO Take 1 tablet by mouth as needed.   lamoTRIgine 25 MG tablet Commonly known as: LAMICTAL Take 50 mg by mouth daily.   melatonin 5 MG Tabs Take 5 mg by mouth at bedtime.   multivitamin with minerals Tabs tablet Take 1 tablet by mouth daily.         Camelia Eng, MSN, CNM 02/12/2021 5:12 PM

## 2021-03-10 ENCOUNTER — Inpatient Hospital Stay (HOSPITAL_COMMUNITY)
Admission: AD | Admit: 2021-03-10 | Discharge: 2021-03-11 | Disposition: A | Discharge status: Home / Self Care | Payer: No Typology Code available for payment source | Attending: Obstetrics & Gynecology | Admitting: Obstetrics & Gynecology

## 2021-03-10 ENCOUNTER — Encounter (HOSPITAL_COMMUNITY): Payer: Self-pay | Admitting: Obstetrics & Gynecology

## 2021-03-10 DIAGNOSIS — O26893 Other specified pregnancy related conditions, third trimester: Secondary | ICD-10-CM

## 2021-03-10 DIAGNOSIS — N898 Other specified noninflammatory disorders of vagina: Secondary | ICD-10-CM

## 2021-03-10 DIAGNOSIS — Z3A30 30 weeks gestation of pregnancy: Secondary | ICD-10-CM | POA: Insufficient documentation

## 2021-03-10 DIAGNOSIS — R102 Pelvic and perineal pain: Secondary | ICD-10-CM | POA: Insufficient documentation

## 2021-03-10 DIAGNOSIS — R030 Elevated blood-pressure reading, without diagnosis of hypertension: Secondary | ICD-10-CM | POA: Diagnosis not present

## 2021-03-10 DIAGNOSIS — Z79899 Other long term (current) drug therapy: Secondary | ICD-10-CM | POA: Diagnosis not present

## 2021-03-10 LAB — COMPREHENSIVE METABOLIC PANEL
ALT: 25 U/L (ref 0–44)
AST: 25 U/L (ref 15–41)
Albumin: 2.8 g/dL — ABNORMAL LOW (ref 3.5–5.0)
Alkaline Phosphatase: 63 U/L (ref 38–126)
Anion gap: 8 (ref 5–15)
BUN: <5 mg/dL — ABNORMAL LOW (ref 6–20)
CO2: 21 mmol/L — ABNORMAL LOW (ref 22–32)
Calcium: 8.5 mg/dL — ABNORMAL LOW (ref 8.9–10.3)
Chloride: 106 mmol/L (ref 98–111)
Creatinine, Ser: 0.65 mg/dL (ref 0.44–1.00)
GFR, Estimated: >60 mL/min (ref >60)
Glucose, Bld: 95 mg/dL (ref 70–99)
Potassium: 3.6 mmol/L (ref 3.5–5.1)
Sodium: 135 mmol/L (ref 135–145)
Total Bilirubin: 0.3 mg/dL (ref 0.3–1.2)
Total Protein: 6 g/dL — ABNORMAL LOW (ref 6.5–8.1)

## 2021-03-10 LAB — CBC
HCT: 31.9 % — ABNORMAL LOW (ref 36.0–46.0)
Hemoglobin: 10.7 g/dL — ABNORMAL LOW (ref 12.0–15.0)
MCH: 31.2 pg (ref 26.0–34.0)
MCHC: 33.5 g/dL (ref 30.0–36.0)
MCV: 93 fL (ref 80.0–100.0)
Platelets: 223 10*3/uL (ref 150–400)
RBC: 3.43 MIL/uL — ABNORMAL LOW (ref 3.87–5.11)
RDW: 11.6 % (ref 11.5–15.5)
WBC: 9.2 10*3/uL (ref 4.0–10.5)
nRBC: 0 % (ref 0.0–0.2)

## 2021-03-10 LAB — PROTEIN / CREATININE RATIO, URINE
Creatinine, Urine: 36.16 mg/dL
Total Protein, Urine: <6 mg/dL

## 2021-03-10 MED ORDER — ACETAMINOPHEN 500 MG PO TABS
1000.0000 mg | ORAL_TABLET | Freq: Once | ORAL | Status: AC
  Administered 2021-03-10: 1000 mg via ORAL
  Filled 2021-03-10: qty 2

## 2021-03-10 NOTE — MAU Note (Signed)
PT SAYS SHE GOES TO [facility]  FOR [facility]  CLEAR LIQUID CAME OUT AT 815 SMALL AMT VB IN SHOWER FELT URGE TO PUSH AT REGISTRATION

## 2021-03-10 NOTE — MAU Provider Note (Signed)
Chief Complaint:  Abdominal Pain    HPI: [name] is a 35 y.o. G1P0 at [redacted]w[redacted]d who presents to maternity admissions reporting a gush of fluid and pelvic pain. Patient reports that around 8:15p she had a gush of fluid after getting out of the shower. While checking in, felt an urge to push and was brought to room via wheelchair. She denies contractions or vaginal bleeding. Endorses active fetal movement. Patient receives [facility], however did not think she would be able to make it there for evaluation. Pregnancy is complicated by A1GDM.  Pregnancy Course:   Past Medical History:  Diagnosis Date   Anxiety    Bipolar disorder (HCC)    Deep vein blood clot of left lower extremity (HCC)    Family history of breast cancer    Genetic testing 05/03/2017   Common Cancers panel (47 genes) @ [facility] - No pathogenic mutations detected   Hypercholesterolemia    Myalgia    Seasonal allergies    Vitamin D deficiency    OB History  Gravida Para Term Preterm AB Living  1            SAB IAB Ectopic Multiple Live Births               # Outcome Date GA Lbr Len/2nd Weight Sex Delivery Anes PTL Lv  1 Current            Past Surgical History:  Procedure Laterality Date   COLONOSCOPY  02/02/2019   [facility]   TONSILLECTOMY AND ADENOIDECTOMY     Family History  Problem Relation Age of Onset   Cancer Mother    Ovarian cancer Mother        stage 4    Breast cancer Mother        double mastectomy in 2019   Prostate cancer Paternal Uncle        deceased 31s   Prostate cancer Paternal Uncle        deceased 64s   Colon cancer Paternal Uncle    Breast cancer Maternal Grandmother 45       2nd primary vs. recurrence in 21s   Breast cancer Paternal Grandmother    Colon cancer Paternal Grandfather    Breast cancer Cousin    Breast cancer Cousin    Breast cancer Other        mother's paternal first cousin; dx 32s   Breast cancer Other        paternal grandmother's  sister; currently 50s   Esophageal cancer Neg Hx    Social History   Tobacco Use   Smoking status: Never   Smokeless tobacco: Never  Vaping Use   Vaping Use: Never used  Substance Use Topics   Alcohol use: Yes    Alcohol/week: 2.0 standard drinks    Types: 1 Glasses of wine, 1 Cans of beer per week    Comment: occasionally   Drug use: No   Allergies  Allergen Reactions   Dust Mite Extract Shortness Of Breath    Grass, Cats, weeds (Requires Epipen)   Pollen Extract Anaphylaxis and Hives   Medications Prior to Admission  Medication Sig Dispense Refill Last Dose   ARIPiprazole (ABILIFY) 20 MG tablet Take 20 mg by mouth at bedtime.    03/09/2021   lamoTRIgine (LAMICTAL) 25 MG tablet Take 50 mg by mouth daily.   03/09/2021   Melatonin 5 MG TABS Take 5 mg by mouth at bedtime.  03/09/2021   Multiple Vitamin (MULTIVITAMIN WITH MINERALS) TABS tablet Take 1 tablet by mouth daily.   03/09/2021   HYDROcodone-acetaminophen (NORCO) 10-325 MG tablet Take 1 tablet by mouth as needed.   More than a month   hydrocortisone (ANUSOL-HC) 2.5 % rectal cream Place 1 application rectally 4 (four) times daily as needed for hemorrhoids or anal itching.      I have reviewed patient's Past Medical Hx, Surgical Hx, Family Hx, Social Hx, medications and allergies.   ROS:  Review of Systems  Constitutional: Negative.   Respiratory: Negative.    Cardiovascular: Negative.   Gastrointestinal: Negative.   Genitourinary:  Positive for pelvic pain and vaginal discharge. Negative for dysuria and vaginal bleeding.  Musculoskeletal: Negative.   Neurological: Negative.   Psychiatric/Behavioral: Negative.     Physical Exam  Patient Vitals for the past 24 hrs:  BP Pulse SpO2  03/11/21 0016 104/72 89 --  03/11/21 0001 133/68 88 --  03/10/21 2346 (!) 144/69 94 --  03/10/21 2331 (!) 143/66 85 --  03/10/21 2316 (!) 142/79 93 --  03/10/21 2301 (!) 165/70 92 --  03/10/21 2245 140/68 88 99 %  03/10/21 2230 (!)  143/67 89 99 %  03/10/21 2215 135/65 82 100 %  03/10/21 2200 137/75 91 99 %  03/10/21 2146 90/72 90 --  03/10/21 2131 132/66 90 --  03/10/21 2125 -- -- 100 %  03/10/21 2120 -- -- 99 %  03/10/21 2116 135/68 91 --   Constitutional: well-developed, well-nourished female in no acute distress.  Cardiovascular: normal rate Respiratory: normal effort GI: abd soft, non-tender, gravid  MS: extremities nontender, no edema, normal ROM Neurologic: alert and oriented x 4.  Pelvic: NEFG, thick white discharge present, no pooling, no blood, cervix clean without lesions/masses, no CMT  Dilation: Closed Effacement (%): Thick Exam by:: Keland Peyton, CNM  FHT: Baseline 135 bpm, moderate variability, 15x15 accelerations present, no decelerations Toco: occasional ui   Labs: Results for orders placed or performed during the hospital encounter of 03/10/21 (from the past 24 hour(s))  CBC     Status: Abnormal   Collection Time: 03/10/21  9:25 PM  Result Value Ref Range   WBC 9.2 4.0 - 10.5 K/uL   RBC 3.43 (L) 3.87 - 5.11 MIL/uL   Hemoglobin 10.7 (L) 12.0 - 15.0 g/dL   HCT 18.5 (L) 63.1 - 49.7 %   MCV 93.0 80.0 - 100.0 fL   MCH 31.2 26.0 - 34.0 pg   MCHC 33.5 30.0 - 36.0 g/dL   RDW 02.6 37.8 - 58.8 %   Platelets 223 150 - 400 K/uL   nRBC 0.0 0.0 - 0.2 %  Comprehensive metabolic panel     Status: Abnormal   Collection Time: 03/10/21  9:25 PM  Result Value Ref Range   Sodium 135 135 - 145 mmol/L   Potassium 3.6 3.5 - 5.1 mmol/L   Chloride 106 98 - 111 mmol/L   CO2 21 (L) 22 - 32 mmol/L   Glucose, Bld 95 70 - 99 mg/dL   BUN <5 (L) 6 - 20 mg/dL   Creatinine, Ser 5.02 0.44 - 1.00 mg/dL   Calcium 8.5 (L) 8.9 - 10.3 mg/dL   Total Protein 6.0 (L) 6.5 - 8.1 g/dL   Albumin 2.8 (L) 3.5 - 5.0 g/dL   AST 25 15 - 41 U/L   ALT 25 0 - 44 U/L   Alkaline Phosphatase 63 38 - 126 U/L   Total Bilirubin 0.3 0.3 - 1.2  mg/dL   GFR, Estimated >81 >01 mL/min   Anion gap 8 5 - 15  Protein / creatinine ratio, urine      Status: None   Collection Time: 03/10/21  9:50 PM  Result Value Ref Range   Creatinine, Urine 36.16 mg/dL   Total Protein, Urine <6 mg/dL   Protein Creatinine Ratio        0.00 - 0.15 mg/mg[Cre]    Imaging:    MAU Course: Orders Placed This Encounter  Procedures   CBC   Comprehensive metabolic panel   Protein / creatinine ratio, urine   Discharge patient   No orders of the defined types were placed in this encounter.   MDM: CBC, CMP, UPCR wnl Negative pooling, fern negative NST reactive and reassuring, toco with occasional ui Cervix closed/thick   Assessment: 1. [redacted] weeks gestation of pregnancy   2. Vaginal discharge during pregnancy in third trimester   3. Elevated BP without diagnosis of hypertension     Plan: Discharge home in stable condition  Preterm labor precautions, preeclampsia, and fetal kick counts reviewed Patient to call OBGYN for BP check on Monday Return to MAU as needed   Allergies as of 03/11/2021       Reactions   Dust Mite Extract Shortness Of Breath   Grass, Cats, weeds (Requires Epipen)   Pollen Extract Anaphylaxis, Hives        Medication List     STOP taking these medications    HYDROcodone-acetaminophen 10-325 MG tablet Commonly known as: NORCO       TAKE these medications    Anusol-HC 2.5 % rectal cream Generic drug: hydrocortisone Place 1 application rectally 4 (four) times daily as needed for hemorrhoids or anal itching.   ARIPiprazole 20 MG tablet Commonly known as: ABILIFY Take 20 mg by mouth at bedtime.   lamoTRIgine 25 MG tablet Commonly known as: LAMICTAL Take 50 mg by mouth daily.   melatonin 5 MG Tabs Take 5 mg by mouth at bedtime.   multivitamin with minerals Tabs tablet Take 1 tablet by mouth daily.         Camelia Eng, CNM 03/10/2021 10:34 PM

## 2021-03-11 DIAGNOSIS — O26893 Other specified pregnancy related conditions, third trimester: Secondary | ICD-10-CM | POA: Diagnosis not present

## 2021-03-14 DIAGNOSIS — Z419 Encounter for procedure for purposes other than remedying health state, unspecified: Secondary | ICD-10-CM | POA: Diagnosis not present

## 2021-03-24 DIAGNOSIS — R45851 Suicidal ideations: Secondary | ICD-10-CM | POA: Diagnosis not present

## 2021-03-24 DIAGNOSIS — F314 Bipolar disorder, current episode depressed, severe, without psychotic features: Secondary | ICD-10-CM | POA: Diagnosis not present

## 2021-04-11 DIAGNOSIS — O24410 Gestational diabetes mellitus in pregnancy, diet controlled: Secondary | ICD-10-CM | POA: Diagnosis not present

## 2021-04-11 DIAGNOSIS — F319 Bipolar disorder, unspecified: Secondary | ICD-10-CM | POA: Diagnosis not present

## 2021-04-11 DIAGNOSIS — Z3A34 34 weeks gestation of pregnancy: Secondary | ICD-10-CM | POA: Diagnosis not present

## 2021-04-11 DIAGNOSIS — Z3689 Encounter for other specified antenatal screening: Secondary | ICD-10-CM | POA: Diagnosis not present

## 2021-04-11 DIAGNOSIS — O99343 Other mental disorders complicating pregnancy, third trimester: Secondary | ICD-10-CM | POA: Diagnosis not present

## 2021-04-13 DIAGNOSIS — Z419 Encounter for procedure for purposes other than remedying health state, unspecified: Secondary | ICD-10-CM | POA: Diagnosis not present

## 2021-05-06 DIAGNOSIS — O10919 Unspecified pre-existing hypertension complicating pregnancy, unspecified trimester: Secondary | ICD-10-CM | POA: Diagnosis not present

## 2021-05-06 DIAGNOSIS — O24419 Gestational diabetes mellitus in pregnancy, unspecified control: Secondary | ICD-10-CM | POA: Diagnosis not present

## 2021-05-06 DIAGNOSIS — Z3A38 38 weeks gestation of pregnancy: Secondary | ICD-10-CM | POA: Diagnosis not present

## 2021-05-12 ENCOUNTER — Encounter (HOSPITAL_COMMUNITY): Payer: Self-pay | Admitting: Obstetrics & Gynecology

## 2021-05-12 ENCOUNTER — Inpatient Hospital Stay (HOSPITAL_COMMUNITY): Payer: PRIVATE HEALTH INSURANCE

## 2021-05-12 ENCOUNTER — Other Ambulatory Visit: Payer: Self-pay

## 2021-05-12 ENCOUNTER — Observation Stay (HOSPITAL_COMMUNITY)
Admission: AD | Admit: 2021-05-12 | Discharge: 2021-05-13 | Disposition: A | Discharge status: Home / Self Care | Payer: PRIVATE HEALTH INSURANCE | Attending: Obstetrics and Gynecology | Admitting: Obstetrics and Gynecology

## 2021-05-12 DIAGNOSIS — R0602 Shortness of breath: Secondary | ICD-10-CM | POA: Diagnosis present

## 2021-05-12 DIAGNOSIS — R059 Cough, unspecified: Secondary | ICD-10-CM | POA: Insufficient documentation

## 2021-05-12 DIAGNOSIS — O165 Unspecified maternal hypertension, complicating the puerperium: Secondary | ICD-10-CM | POA: Insufficient documentation

## 2021-05-12 DIAGNOSIS — I517 Cardiomegaly: Secondary | ICD-10-CM | POA: Insufficient documentation

## 2021-05-12 DIAGNOSIS — Z20822 Contact with and (suspected) exposure to covid-19: Secondary | ICD-10-CM | POA: Diagnosis not present

## 2021-05-12 DIAGNOSIS — O9943 Diseases of the circulatory system complicating the puerperium: Secondary | ICD-10-CM | POA: Diagnosis not present

## 2021-05-12 LAB — D-DIMER, QUANTITATIVE: D-Dimer, Quant: 3.94 ug/mL-FEU — ABNORMAL HIGH (ref 0.00–0.50)

## 2021-05-12 LAB — CBC WITH DIFFERENTIAL/PLATELET
Abs Immature Granulocytes: 0.38 10*3/uL — ABNORMAL HIGH (ref 0.00–0.07)
Basophils Absolute: 0 10*3/uL (ref 0.0–0.1)
Basophils Relative: 0 %
Eosinophils Absolute: 0.2 10*3/uL (ref 0.0–0.5)
Eosinophils Relative: 2 %
HCT: 28.3 % — ABNORMAL LOW (ref 36.0–46.0)
Hemoglobin: 8.9 g/dL — ABNORMAL LOW (ref 12.0–15.0)
Immature Granulocytes: 4 %
Lymphocytes Relative: 18 %
Lymphs Abs: 1.8 10*3/uL (ref 0.7–4.0)
MCH: 29.1 pg (ref 26.0–34.0)
MCHC: 31.4 g/dL (ref 30.0–36.0)
MCV: 92.5 fL (ref 80.0–100.0)
Monocytes Absolute: 0.5 10*3/uL (ref 0.1–1.0)
Monocytes Relative: 5 %
Neutro Abs: 6.9 10*3/uL (ref 1.7–7.7)
Neutrophils Relative %: 71 %
Platelets: 371 10*3/uL (ref 150–400)
RBC: 3.06 MIL/uL — ABNORMAL LOW (ref 3.87–5.11)
RDW: 13.1 % (ref 11.5–15.5)
WBC: 9.8 10*3/uL (ref 4.0–10.5)
nRBC: 0.4 % — ABNORMAL HIGH (ref 0.0–0.2)

## 2021-05-12 LAB — COMPREHENSIVE METABOLIC PANEL
ALT: 45 U/L — ABNORMAL HIGH (ref 0–44)
AST: 40 U/L (ref 15–41)
Albumin: 2.5 g/dL — ABNORMAL LOW (ref 3.5–5.0)
Alkaline Phosphatase: 84 U/L (ref 38–126)
Anion gap: 7 (ref 5–15)
BUN: 9 mg/dL (ref 6–20)
CO2: 23 mmol/L (ref 22–32)
Calcium: 8.1 mg/dL — ABNORMAL LOW (ref 8.9–10.3)
Chloride: 110 mmol/L (ref 98–111)
Creatinine, Ser: 0.74 mg/dL (ref 0.44–1.00)
GFR, Estimated: >60 mL/min (ref >60)
Glucose, Bld: 86 mg/dL (ref 70–99)
Potassium: 3.8 mmol/L (ref 3.5–5.1)
Sodium: 140 mmol/L (ref 135–145)
Total Bilirubin: 0.4 mg/dL (ref 0.3–1.2)
Total Protein: 5.8 g/dL — ABNORMAL LOW (ref 6.5–8.1)

## 2021-05-12 LAB — RESP PANEL BY RT-PCR (FLU A&B, COVID) ARPGX2
Influenza A by PCR: NEGATIVE
Influenza B by PCR: NEGATIVE
SARS Coronavirus 2 by RT PCR: NEGATIVE

## 2021-05-12 LAB — TYPE AND SCREEN
ABO/RH(D): A POS
Antibody Screen: NEGATIVE

## 2021-05-12 LAB — TROPONIN I (HIGH SENSITIVITY): Troponin I (High Sensitivity): 15 ng/L (ref <18)

## 2021-05-12 LAB — BRAIN NATRIURETIC PEPTIDE: B Natriuretic Peptide: 318.4 pg/mL — ABNORMAL HIGH (ref 0.0–100.0)

## 2021-05-12 MED ORDER — DOCUSATE SODIUM 100 MG PO CAPS
100.0000 mg | ORAL_CAPSULE | Freq: Every day | ORAL | Status: DC
  Filled 2021-05-12: qty 1

## 2021-05-12 MED ORDER — ZOLPIDEM TARTRATE 5 MG PO TABS: 5.0000 mg | ORAL_TABLET | Freq: Every evening | ORAL | Status: DC | PRN

## 2021-05-12 MED ORDER — FUROSEMIDE 20 MG PO TABS
20.0000 mg | ORAL_TABLET | Freq: Two times a day (BID) | ORAL | Status: DC
  Administered 2021-05-13: 20 mg via ORAL
  Filled 2021-05-12 (×2): qty 1

## 2021-05-12 MED ORDER — LAMOTRIGINE 25 MG PO TABS
50.0000 mg | ORAL_TABLET | Freq: Every day | ORAL | Status: DC
  Administered 2021-05-13: 50 mg via ORAL
  Filled 2021-05-12: qty 2

## 2021-05-12 MED ORDER — ACETAMINOPHEN 325 MG PO TABS
650.0000 mg | ORAL_TABLET | ORAL | Status: DC | PRN
  Administered 2021-05-13: 650 mg via ORAL
  Filled 2021-05-12: qty 2

## 2021-05-12 MED ORDER — HYDROCORTISONE (PERIANAL) 2.5 % EX CREA
1.0000 "application " | TOPICAL_CREAM | Freq: Four times a day (QID) | CUTANEOUS | Status: DC | PRN
  Filled 2021-05-12: qty 28.35

## 2021-05-12 MED ORDER — CALCIUM CARBONATE ANTACID 500 MG PO CHEW: 2.0000 | CHEWABLE_TABLET | ORAL | Status: DC | PRN

## 2021-05-12 MED ORDER — ARIPIPRAZOLE 10 MG PO TABS
20.0000 mg | ORAL_TABLET | Freq: Every day | ORAL | Status: DC
  Administered 2021-05-12: 23:00:00 20 mg via ORAL
  Filled 2021-05-12 (×2): qty 2

## 2021-05-12 MED ORDER — FUROSEMIDE 10 MG/ML IJ SOLN
40.0000 mg | Freq: Once | INTRAMUSCULAR | Status: AC
  Administered 2021-05-12: 20:00:00 40 mg via INTRAVENOUS
  Filled 2021-05-12: qty 4

## 2021-05-12 MED ORDER — IOHEXOL 350 MG/ML SOLN
65.0000 mL | Freq: Once | INTRAVENOUS | Status: AC | PRN
  Administered 2021-05-12: 21:00:00 65 mL via INTRAVENOUS

## 2021-05-12 MED ORDER — FUROSEMIDE 10 MG/ML IJ SOLN
20.0000 mg | Freq: Once | INTRAMUSCULAR | Status: DC
  Filled 2021-05-12: qty 2

## 2021-05-12 NOTE — MAU Note (Addendum)
...  [name] is a 35 y.o. at 5 days post partum C/S here in MAU reporting: Cough, SOB, and bilateral foot swelling for three days now. She states when she lies down she feels as if she cannot breathe and her coughing gets worse. Denies pain. Formula feeding.  Delivered 05/07/2021. Pre-eclampsia and GDM in pregnancy. Patient received magnesium and had two iron infusions post partum.

## 2021-05-12 NOTE — H&P (Signed)
History     CSN: [id number]  Arrival date and time: 05/12/21 1655   Event Date/Time   First Provider Initiated Contact with Patient 05/12/21 1755      Chief Complaint  Patient presents with   Cough   Shortness of Breath   Foot Swelling   HPI [name] is a 35 y.o. G1P1001 postpartum from a primary c/s on 12/25 who presents with shortness of breath. She states it started yesterday and has progressively gotten worse today. She reports when she lays down, she feels like she cannot breathe. She also reports worsening swelling in her lower extremities.  She delivered at [facility] via primary c/s on 12/25. She had CHTN with super imposed preeclampsia. She received 24 hours of postpartum magnesium and was not discharged home on any medications.   OB History     Gravida  1   Para  1   Term  1   Preterm      AB      Living  1      SAB      IAB      Ectopic      Multiple      Live Births  1           Past Medical History:  Diagnosis Date   Anxiety    Bipolar disorder (HCC)    Deep vein blood clot of left lower extremity (HCC)    Family history of breast cancer    Genetic testing 05/03/2017   Common Cancers panel (47 genes) @ [facility] - No pathogenic mutations detected   Hypercholesterolemia    Myalgia    Seasonal allergies    Vitamin D deficiency     Past Surgical History:  Procedure Laterality Date   CESAREAN SECTION     COLONOSCOPY  02/02/2019   [facility]   SALPINGECTOMY Bilateral    TONSILLECTOMY AND ADENOIDECTOMY      Family History  Problem Relation Age of Onset   Cancer Mother    Ovarian cancer Mother        stage 4    Breast cancer Mother        double mastectomy in 2019   Prostate cancer Paternal Uncle        deceased 35s   Prostate cancer Paternal Uncle        deceased 73s   Colon cancer Paternal Uncle    Breast cancer Maternal Grandmother 45       2nd primary vs. recurrence in 30s   Breast cancer  Paternal Grandmother    Colon cancer Paternal Grandfather    Breast cancer Cousin    Breast cancer Cousin    Breast cancer Other        mother's paternal first cousin; dx 60s   Breast cancer Other        paternal grandmother's sister; currently 72s   Esophageal cancer Neg Hx     Social History   Tobacco Use   Smoking status: Never   Smokeless tobacco: Never  Vaping Use   Vaping Use: Never used  Substance Use Topics   Alcohol use: Yes    Alcohol/week: 2.0 standard drinks    Types: 1 Glasses of wine, 1 Cans of beer per week    Comment: occasionally   Drug use: No    Allergies:  Allergies  Allergen Reactions   Dust Mite Extract Shortness Of Breath    Grass, Cats, weeds (Requires Epipen)  Pollen Extract Anaphylaxis and Hives   Cat Hair Extract     Sniffles     Medications Prior to Admission  Medication Sig Dispense Refill Last Dose   ARIPiprazole (ABILIFY) 20 MG tablet Take 20 mg by mouth at bedtime.       hydrocortisone (ANUSOL-HC) 2.5 % rectal cream Place 1 application rectally 4 (four) times daily as needed for hemorrhoids or anal itching.      lamoTRIgine (LAMICTAL) 25 MG tablet Take 50 mg by mouth daily.      Melatonin 5 MG TABS Take 5 mg by mouth at bedtime.      Multiple Vitamin (MULTIVITAMIN WITH MINERALS) TABS tablet Take 1 tablet by mouth daily.       Review of Systems  Constitutional: Negative.  Negative for fatigue and fever.  HENT: Negative.    Respiratory:  Positive for cough, chest tightness and shortness of breath.   Cardiovascular: Negative.  Negative for chest pain.  Gastrointestinal: Negative.  Negative for abdominal pain, constipation, diarrhea, nausea and vomiting.  Genitourinary:  Positive for vaginal bleeding. Negative for dysuria and vaginal discharge.  Neurological: Negative.  Negative for dizziness and headaches.  Physical Exam   Blood pressure (!) 153/86, pulse 73, temperature 97.6 F (36.4 C), temperature source Oral, resp. rate 17,  SpO2 97 %, not currently breastfeeding.  Patient Vitals for the past 24 hrs:  BP Temp Temp src Pulse Resp SpO2  05/12/21 1901 138/81 -- -- 67 -- --  05/12/21 1845 (!) 144/78 -- -- 72 -- 97 %  05/12/21 1816 136/82 -- -- 72 -- --  05/12/21 1801 (!) 125/109 -- -- 79 -- --  05/12/21 1745 (!) 146/88 -- -- 82 -- 96 %  05/12/21 1719 (!) 153/86 97.6 F (36.4 C) Oral 73 17 97 %   Physical Exam Vitals and nursing note reviewed.  Constitutional:      General: She is not in acute distress.    Appearance: She is well-developed.  HENT:     Head: Normocephalic.  Eyes:     Pupils: Pupils are equal, round, and reactive to light.  Cardiovascular:     Rate and Rhythm: Normal rate and regular rhythm.     Heart sounds: Normal heart sounds.  Pulmonary:     Effort: Pulmonary effort is normal. No respiratory distress.     Breath sounds: Normal breath sounds.  Abdominal:     General: Bowel sounds are normal. There is no distension.     Palpations: Abdomen is soft.     Tenderness: There is no abdominal tenderness.  Skin:    General: Skin is warm and dry.  Neurological:     Mental Status: She is alert and oriented to person, place, and time.  Psychiatric:        Mood and Affect: Mood normal.        Behavior: Behavior normal.        Thought Content: Thought content normal.        Judgment: Judgment normal.    MAU Course  Procedures Results for orders placed or performed during the hospital encounter of 05/12/21 (from the past 24 hour(s))  Resp Panel by RT-PCR (Flu A&B, Covid) Nasopharyngeal Swab     Status: None   Collection Time: 05/12/21  6:31 PM   Specimen: Nasopharyngeal Swab; Nasopharyngeal(NP) swabs in vial transport medium  Result Value Ref Range   SARS Coronavirus 2 by RT PCR NEGATIVE NEGATIVE   Influenza A by PCR NEGATIVE NEGATIVE  Influenza B by PCR NEGATIVE NEGATIVE  CBC with Differential/Platelet     Status: Abnormal   Collection Time: 05/12/21  6:33 PM  Result Value Ref Range    WBC 9.8 4.0 - 10.5 K/uL   RBC 3.06 (L) 3.87 - 5.11 MIL/uL   Hemoglobin 8.9 (L) 12.0 - 15.0 g/dL   HCT 38.7 (L) 56.4 - 33.2 %   MCV 92.5 80.0 - 100.0 fL   MCH 29.1 26.0 - 34.0 pg   MCHC 31.4 30.0 - 36.0 g/dL   RDW 95.1 88.4 - 16.6 %   Platelets 371 150 - 400 K/uL   nRBC 0.4 (H) 0.0 - 0.2 %   Neutrophils Relative % 71 %   Neutro Abs 6.9 1.7 - 7.7 K/uL   Lymphocytes Relative 18 %   Lymphs Abs 1.8 0.7 - 4.0 K/uL   Monocytes Relative 5 %   Monocytes Absolute 0.5 0.1 - 1.0 K/uL   Eosinophils Relative 2 %   Eosinophils Absolute 0.2 0.0 - 0.5 K/uL   Basophils Relative 0 %   Basophils Absolute 0.0 0.0 - 0.1 K/uL   Immature Granulocytes 4 %   Abs Immature Granulocytes 0.38 (H) 0.00 - 0.07 K/uL  Comprehensive metabolic panel     Status: Abnormal   Collection Time: 05/12/21  6:33 PM  Result Value Ref Range   Sodium 140 135 - 145 mmol/L   Potassium 3.8 3.5 - 5.1 mmol/L   Chloride 110 98 - 111 mmol/L   CO2 23 22 - 32 mmol/L   Glucose, Bld 86 70 - 99 mg/dL   BUN 9 6 - 20 mg/dL   Creatinine, Ser 0.63 0.44 - 1.00 mg/dL   Calcium 8.1 (L) 8.9 - 10.3 mg/dL   Total Protein 5.8 (L) 6.5 - 8.1 g/dL   Albumin 2.5 (L) 3.5 - 5.0 g/dL   AST 40 15 - 41 U/L   ALT 45 (H) 0 - 44 U/L   Alkaline Phosphatase 84 38 - 126 U/L   Total Bilirubin 0.4 0.3 - 1.2 mg/dL   GFR, Estimated >01 >60 mL/min   Anion gap 7 5 - 15  Troponin I (High Sensitivity)     Status: None   Collection Time: 05/12/21  6:33 PM  Result Value Ref Range   Troponin I (High Sensitivity) 15 <18 ng/L  Brain natriuretic peptide     Status: Abnormal   Collection Time: 05/12/21  6:33 PM  Result Value Ref Range   B Natriuretic Peptide 318.4 (H) 0.0 - 100.0 pg/mL  D-dimer, quantitative     Status: Abnormal   Collection Time: 05/12/21  6:33 PM  Result Value Ref Range   D-Dimer, Quant 3.94 (H) 0.00 - 0.50 ug/mL-FEU    CT Angio Chest PE W and/or Wo Contrast  Result Date: 05/12/2021 CLINICAL DATA:  Pulmonary embolism (PE) suspected, high  prob. CTA Chest PE Study Patient presents with cough, SOB and foot swelling; Patient denies any CP. EXAM: CT ANGIOGRAPHY CHEST WITH CONTRAST TECHNIQUE: Multidetector CT imaging of the chest was performed using the standard protocol during bolus administration of intravenous contrast. Multiplanar CT image reconstructions and MIPs were obtained to evaluate the vascular anatomy. CONTRAST:  54mL OMNIPAQUE IOHEXOL 350 MG/ML SOLN COMPARISON:  None. FINDINGS: Cardiovascular: Satisfactory opacification of the pulmonary arteries to the segmental level. No evidence of pulmonary embolism. Normal heart size. No pericardial effusion. Mediastinum/Nodes: No enlarged mediastinal, hilar, or axillary lymph nodes. Thyroid gland, trachea, and esophagus demonstrate no significant findings. Lungs/Pleura: Diffuse mild peribronchovascular  ground-glass airspace opacities. Bilateral lower lobe atelectasis and interlobular septal wall thickening. No focal consolidation. No pulmonary nodule. No pulmonary mass. Trace right pleural effusion. No definite left pleural effusion no pneumothorax. Upper Abdomen: No acute abnormality. Musculoskeletal: No chest wall abnormality. No suspicious lytic or blastic osseous lesions. No acute displaced fracture. Multilevel degenerative changes of the spine. Review of the MIP images confirms the above findings. IMPRESSION: 1. No pulmonary embolus. 2. Diffuse mild peribronchovascular ground-glass airspace opacities. Findings may represent pulmonary edema versus infection/inflammation. 3. Trace right pleural effusion. Electronically Signed   By: Tish Frederickson M.D.   On: 05/12/2021 20:54   DG CHEST PORT 1 VIEW  Result Date: 05/12/2021 CLINICAL DATA:  Shortness of breath EXAM: PORTABLE CHEST 1 VIEW COMPARISON:  04/04/2013 FINDINGS: Transverse diameter of heart is increased. Central pulmonary vessels are more prominent. There is slight increase in interstitial markings in the parahilar regions and lower lung  fields. There is patchy infiltrate in medial left lower lung fields. There is minimal blunting of right lateral CP angle. There is no pneumothorax. IMPRESSION: Cardiomegaly. Central pulmonary vessels are more prominent. Increased interstitial markings are seen in the parahilar regions and lower lung fields suggesting interstitial edema or interstitial pneumonia. There is patchy infiltrate in the medial left lower lung fields suggesting atelectasis/pneumonia. Electronically Signed   By: Ernie Avena M.D.   On: 05/12/2021 18:53     MDM CBC with Diff CMP Troponin BNP D Dimer Chest Xray CT PE rule out Resp Panel  Consulted with Dr. Donavan Foil- recommends echocardiogram given new cardiomegaly and risk for postpartum cardiomyopathy  Per echocardiogram tech, cardiology MD must be consulted to do echocardiogram in off shift.  2028: Cardiology MD paged and report given. Will come see patient  2121: Cardiologist at bedside for assessment- recommends overnight diuresis and echocardiogram in the morning.  Dr. Donavan Foil updated on recommendations. Will admit to [facility]  Assessment and Plan   1. Cardiomegaly   2. Shortness of breath   3. Postpartum state    -Admit to [facility] -Care turned over to MD  Rolm Bookbinder CNM 05/12/2021, 6:21 PM

## 2021-05-12 NOTE — MAU Note (Signed)
Cardiology in to examine pt.

## 2021-05-12 NOTE — Consult Note (Signed)
Cardiology Consultation:   Patient ID: [name] MRN: [id number]; DOB: July 08, 1985  Admit date: 05/12/2021 Date of Consult: 05/12/2021  PCP:  [facility]   [facility] Providers Cardiologist:  None        Patient Profile:   [name] is a 35 y.o. female with a hx of pre-eclampsia and Gestational DM who is 5 days post delivery (C-section) who is being seen 05/12/2021 for the evaluation of SOB, LE edema, fluid overload at the request of Dr Donavan Foil.  History of Present Illness:    [name] is a 35 y.o. female with a hx of pre-eclampsia and Gestational DM who is 5 days post delivery (C-section) who is being seen 05/12/2021 for the evaluation of SOB, LE edema, fluid overload at the request of Dr Donavan Foil.  Patient denies any prior cardiac problems, no family h/o CMP, premature death or SCD, no family h/o peri/post partum CMP. She had normal pregnancy until towards the end/delivery- noted to have pre-eclampsia. She delivered a healthy baby boy 12/25 and got discharged 2 days later. Since getting home- she has been having progressive SOB, orthopnea, pnd, LE edema, barely can walk a few steps before gettting sob. No other symptoms of fevers, chills, cough, chest pain. Abd pain from C-section is present but better.   Work up so far: EKG pending, Trop negative, BNP 318, normal cr, anemic Hb 8.9 CXR read as cardiomegaly and pulmonary congestion CTA chest:" negative for PE, pulmonary edema, right pleural effusion  Bedside ECHO done by me: normal Biventricular function, IVC is normal and collapsable, no pericardial effusion    Past Medical History:  Diagnosis Date   Anxiety    Bipolar disorder (HCC)    Deep vein blood clot of left lower extremity (HCC)    Family history of breast cancer    Genetic testing 05/03/2017   Common Cancers panel (47 genes) @ [facility] - No pathogenic mutations detected   Hypercholesterolemia     Myalgia    Seasonal allergies    Vitamin D deficiency     Past Surgical History:  Procedure Laterality Date   CESAREAN SECTION     COLONOSCOPY  02/02/2019   [facility]   SALPINGECTOMY Bilateral    TONSILLECTOMY AND ADENOIDECTOMY       Home Medications:  Prior to Admission medications   Medication Sig Start Date End Date Taking? Authorizing Provider  ARIPiprazole (ABILIFY) 20 MG tablet Take 20 mg by mouth at bedtime.     [provider]  hydrocortisone (ANUSOL-HC) 2.5 % rectal cream Place 1 application rectally 4 (four) times daily as needed for hemorrhoids or anal itching.    [provider]  lamoTRIgine (LAMICTAL) 25 MG tablet Take 50 mg by mouth daily.    [provider]  Melatonin 5 MG TABS Take 5 mg by mouth at bedtime.    [provider]  Multiple Vitamin (MULTIVITAMIN WITH MINERALS) TABS tablet Take 1 tablet by mouth daily.    [provider]    Inpatient Medications: Scheduled Meds:  ARIPiprazole  20 mg Oral QHS   [START ON 05/13/2021] docusate sodium  100 mg Oral Daily   [START ON 05/13/2021] furosemide  20 mg Oral BID   [START ON 05/13/2021] lamoTRIgine  50 mg Oral Daily   Continuous Infusions:  PRN Meds: acetaminophen, calcium carbonate, hydrocortisone, zolpidem  Allergies:    Allergies  Allergen Reactions   Dust Mite Extract Shortness Of Breath  Grass, Cats, weeds (Requires Epipen)   Pollen Extract Anaphylaxis and Hives   Cat Hair Extract     Sniffles     Social History:   Social History   Socioeconomic History   Marital status: Married    Spouse name: Not on file   Number of children: Not on file   Years of education: Not on file   Highest education level: Not on file  Occupational History   Not on file  Tobacco Use   Smoking status: Never   Smokeless tobacco: Never  Vaping Use   Vaping Use: Never used  Substance and Sexual Activity   Alcohol use: Yes    Alcohol/week: 2.0 standard  drinks    Types: 1 Glasses of wine, 1 Cans of beer per week    Comment: occasionally   Drug use: No   Sexual activity: Yes  Other Topics Concern   Not on file  Social History Narrative   Not on file   Social Determinants of Health   Financial Resource Strain: Not on file  Food Insecurity: Not on file  Transportation Needs: Not on file  Physical Activity: Not on file  Stress: Not on file  Social Connections: Not on file  Intimate Partner Violence: Not on file    Family History:    Family History  Problem Relation Age of Onset   Cancer Mother    Ovarian cancer Mother        stage 4    Breast cancer Mother        double mastectomy in 2019   Prostate cancer Paternal Uncle        deceased 2s   Prostate cancer Paternal Uncle        deceased 21s   Colon cancer Paternal Uncle    Breast cancer Maternal Grandmother 45       2nd primary vs. recurrence in 41s   Breast cancer Paternal Grandmother    Colon cancer Paternal Grandfather    Breast cancer Cousin    Breast cancer Cousin    Breast cancer Other        mother's paternal first cousin; dx 17s   Breast cancer Other        paternal grandmother's sister; currently 5s   Esophageal cancer Neg Hx      ROS:  Please see the history of present illness.   All other ROS reviewed and negative.     Physical Exam/Data:   Vitals:   05/12/21 1801 05/12/21 1816 05/12/21 1845 05/12/21 1901  BP: (!) 125/109 136/82 (!) 144/78 138/81  Pulse: 79 72 72 67  Resp:      Temp:      TempSrc:      SpO2:   97%     Intake/Output Summary (Last 24 hours) at 05/12/2021 2228 Last data filed at 05/12/2021 1930 Gross per 24 hour  Intake --  Output 800 ml  Net -800 ml   Last 3 Weights 10/03/2020 09/17/2020 06/03/2020  Weight (lbs) 167 lb 160 lb 160 lb  Weight (kg) 75.75 kg 72.576 kg 72.576 kg     There is no height or weight on file to calculate BMI.  General:  Well nourished, well developed, in no acute distress HEENT: normal Neck:  difficult to appreciate but appears mild JVD upto mid neck, external  Vascular: No carotid bruits; Distal pulses 2+ bilaterally Cardiac:  normal S1, S2; RRR; no murmur  Lungs:  b/l crackles heard+ Abd: soft, nontender, no hepatomegaly  Ext:4+edema Musculoskeletal:  No deformities, BUE and BLE strength normal and equal Skin: warm and dry  Neuro:  CNs 2-12 intact, no focal abnormalities noted Psych:  Normal affect   EKG:  pending Telemetry:  Telemetry was personally reviewed and demonstrates:  NSR  Relevant CV Studies: pending  Laboratory Data:  High Sensitivity Troponin:   Recent Labs  Lab 05/12/21 1833  TROPONINIHS 15     Chemistry Recent Labs  Lab 05/12/21 1833  NA 140  K 3.8  CL 110  CO2 23  GLUCOSE 86  BUN 9  CREATININE 0.74  CALCIUM 8.1*  GFRNONAA >60  ANIONGAP 7    Recent Labs  Lab 05/12/21 1833  PROT 5.8*  ALBUMIN 2.5*  AST 40  ALT 45*  ALKPHOS 84  BILITOT 0.4   Lipids No results for input(s): CHOL, TRIG, HDL, LABVLDL, LDLCALC, CHOLHDL in the last 168 hours.  Hematology Recent Labs  Lab 05/12/21 1833  WBC 9.8  RBC 3.06*  HGB 8.9*  HCT 28.3*  MCV 92.5  MCH 29.1  MCHC 31.4  RDW 13.1  PLT 371   Thyroid No results for input(s): TSH, FREET4 in the last 168 hours.  BNP Recent Labs  Lab 05/12/21 1833  BNP 318.4*    DDimer  Recent Labs  Lab 05/12/21 1833  DDIMER 3.94*     Radiology/Studies:  CT Angio Chest PE W and/or Wo Contrast  Result Date: 05/12/2021 CLINICAL DATA:  Pulmonary embolism (PE) suspected, high prob. CTA Chest PE Study Patient presents with cough, SOB and foot swelling; Patient denies any CP. EXAM: CT ANGIOGRAPHY CHEST WITH CONTRAST TECHNIQUE: Multidetector CT imaging of the chest was performed using the standard protocol during bolus administration of intravenous contrast. Multiplanar CT image reconstructions and MIPs were obtained to evaluate the vascular anatomy. CONTRAST:  82mL OMNIPAQUE IOHEXOL 350 MG/ML SOLN  COMPARISON:  None. FINDINGS: Cardiovascular: Satisfactory opacification of the pulmonary arteries to the segmental level. No evidence of pulmonary embolism. Normal heart size. No pericardial effusion. Mediastinum/Nodes: No enlarged mediastinal, hilar, or axillary lymph nodes. Thyroid gland, trachea, and esophagus demonstrate no significant findings. Lungs/Pleura: Diffuse mild peribronchovascular ground-glass airspace opacities. Bilateral lower lobe atelectasis and interlobular septal wall thickening. No focal consolidation. No pulmonary nodule. No pulmonary mass. Trace right pleural effusion. No definite left pleural effusion no pneumothorax. Upper Abdomen: No acute abnormality. Musculoskeletal: No chest wall abnormality. No suspicious lytic or blastic osseous lesions. No acute displaced fracture. Multilevel degenerative changes of the spine. Review of the MIP images confirms the above findings. IMPRESSION: 1. No pulmonary embolus. 2. Diffuse mild peribronchovascular ground-glass airspace opacities. Findings may represent pulmonary edema versus infection/inflammation. 3. Trace right pleural effusion. Electronically Signed   By: Tish Frederickson M.D.   On: 05/12/2021 20:54   DG CHEST PORT 1 VIEW  Result Date: 05/12/2021 CLINICAL DATA:  Shortness of breath EXAM: PORTABLE CHEST 1 VIEW COMPARISON:  04/04/2013 FINDINGS: Transverse diameter of heart is increased. Central pulmonary vessels are more prominent. There is slight increase in interstitial markings in the parahilar regions and lower lung fields. There is patchy infiltrate in medial left lower lung fields. There is minimal blunting of right lateral CP angle. There is no pneumothorax. IMPRESSION: Cardiomegaly. Central pulmonary vessels are more prominent. Increased interstitial markings are seen in the parahilar regions and lower lung fields suggesting interstitial edema or interstitial pneumonia. There is patchy infiltrate in the medial left lower lung fields  suggesting atelectasis/pneumonia. Electronically Signed   By: Harlan Stains.D.  On: 05/12/2021 18:53     Assessment and Plan:   SOB, LE edema likely secondary to pre-eclampsia and fluid shift Less likely due to cardiomyopathy 2. H/o pre-eclampsia and gestational DM   Plan: - bedside ECHO done by me shows normal Biventricular function, IVC is normal and collapsable, no pericardial effusion  - continue IV lasix 40mg  BID given significant peripheral edema, crackles.  - f/w official ECHO in am (please order a full transthoracic echo) and obtain EKG.  - do not suspect peripartum cmp given normal bedside echo findings.   We will sign off after ECHO in the morning.   Risk Assessment/Risk Scores:                For questions or updates, please contact [facility] Please consult [email or web address] for contact info under    Signed, , MD  05/12/2021 10:28 PM

## 2021-05-13 ENCOUNTER — Other Ambulatory Visit (HOSPITAL_COMMUNITY): Payer: PRIVATE HEALTH INSURANCE

## 2021-05-13 ENCOUNTER — Observation Stay (HOSPITAL_BASED_OUTPATIENT_CLINIC_OR_DEPARTMENT_OTHER): Payer: PRIVATE HEALTH INSURANCE

## 2021-05-13 DIAGNOSIS — R0602 Shortness of breath: Secondary | ICD-10-CM | POA: Diagnosis not present

## 2021-05-13 DIAGNOSIS — I517 Cardiomegaly: Secondary | ICD-10-CM | POA: Diagnosis not present

## 2021-05-13 DIAGNOSIS — O9943 Diseases of the circulatory system complicating the puerperium: Secondary | ICD-10-CM | POA: Diagnosis not present

## 2021-05-13 LAB — ECHOCARDIOGRAM COMPLETE
AR max vel: 2.31 cm2
AV Area VTI: 2.2 cm2
AV Area mean vel: 2.14 cm2
AV Mean grad: 6 mmHg
AV Peak grad: 12 mmHg
Ao pk vel: 1.73 m/s
Area-P 1/2: 3.93 cm2
S' Lateral: 3.1 cm

## 2021-05-13 MED ORDER — FUROSEMIDE 20 MG PO TABS: 20.0000 mg | ORAL_TABLET | Freq: Two times a day (BID) | ORAL | 0 refills | Status: DC

## 2021-05-13 MED ORDER — AMLODIPINE BESYLATE 5 MG PO TABS
5.0000 mg | ORAL_TABLET | Freq: Every day | ORAL | Status: DC
  Administered 2021-05-13: 5 mg via ORAL
  Filled 2021-05-13: qty 1

## 2021-05-13 MED ORDER — AMLODIPINE BESYLATE 5 MG PO TABS: 5.0000 mg | ORAL_TABLET | Freq: Every day | ORAL | 0 refills | Status: DC

## 2021-05-13 NOTE — Discharge Summary (Signed)
Physician Discharge Summary  Patient ID: [name] MRN: [id number] DOB/AGE: [birth year] 35 y.o.  Admit date: 05/12/2021 Discharge date: 05/13/2021  Admission Diagnoses: Shortness of Breath  Discharge Diagnoses:  Principal Problem:   Shortness of breath Active Problems:   Cardiomegaly   Postpartum state H/o chronic HTN with superimposed preeclampsia  Discharged Condition: stable  Hospital Course: 35yo G1P1 admitted due to SOB following recent delivery complicated by cHTN with superimposed preeclampsia.  SOB likely due to fluid overload and was adequately treated with Lasix. Discharged home on 5 day course of Lasix and to start on Norvasc 5mg  daily. Plan for close outpatient follow up in one week.  Consults: cardiology  Significant Diagnostic Studies: labs:  Results for orders placed or performed during the hospital encounter of 05/12/21 (from the past 24 hour(s))  Resp Panel by RT-PCR (Flu A&B, Covid) Nasopharyngeal Swab     Status: None   Collection Time: 05/12/21  6:31 PM   Specimen: Nasopharyngeal Swab; Nasopharyngeal(NP) swabs in vial transport medium  Result Value Ref Range   SARS Coronavirus 2 by RT PCR NEGATIVE NEGATIVE   Influenza A by PCR NEGATIVE NEGATIVE   Influenza B by PCR NEGATIVE NEGATIVE  CBC with Differential/Platelet     Status: Abnormal   Collection Time: 05/12/21  6:33 PM  Result Value Ref Range   WBC 9.8 4.0 - 10.5 K/uL   RBC 3.06 (L) 3.87 - 5.11 MIL/uL   Hemoglobin 8.9 (L) 12.0 - 15.0 g/dL   HCT 05/14/21 (L) 62.9 - 52.8 %   MCV 92.5 80.0 - 100.0 fL   MCH 29.1 26.0 - 34.0 pg   MCHC 31.4 30.0 - 36.0 g/dL   RDW 41.3 24.4 - 01.0 %   Platelets 371 150 - 400 K/uL   nRBC 0.4 (H) 0.0 - 0.2 %   Neutrophils Relative % 71 %   Neutro Abs 6.9 1.7 - 7.7 K/uL   Lymphocytes Relative 18 %   Lymphs Abs 1.8 0.7 - 4.0 K/uL   Monocytes Relative 5 %   Monocytes Absolute 0.5 0.1 - 1.0 K/uL   Eosinophils Relative 2 %   Eosinophils Absolute 0.2 0.0 - 0.5 K/uL    Basophils Relative 0 %   Basophils Absolute 0.0 0.0 - 0.1 K/uL   Immature Granulocytes 4 %   Abs Immature Granulocytes 0.38 (H) 0.00 - 0.07 K/uL  Comprehensive metabolic panel     Status: Abnormal   Collection Time: 05/12/21  6:33 PM  Result Value Ref Range   Sodium 140 135 - 145 mmol/L   Potassium 3.8 3.5 - 5.1 mmol/L   Chloride 110 98 - 111 mmol/L   CO2 23 22 - 32 mmol/L   Glucose, Bld 86 70 - 99 mg/dL   BUN 9 6 - 20 mg/dL   Creatinine, Ser 05/14/21 0.44 - 1.00 mg/dL   Calcium 8.1 (L) 8.9 - 10.3 mg/dL   Total Protein 5.8 (L) 6.5 - 8.1 g/dL   Albumin 2.5 (L) 3.5 - 5.0 g/dL   AST 40 15 - 41 U/L   ALT 45 (H) 0 - 44 U/L   Alkaline Phosphatase 84 38 - 126 U/L   Total Bilirubin 0.4 0.3 - 1.2 mg/dL   GFR, Estimated 5.36 >64 mL/min   Anion gap 7 5 - 15  Troponin I (High Sensitivity)     Status: None   Collection Time: 05/12/21  6:33 PM  Result Value Ref Range   Troponin I (High Sensitivity) 15 <18 ng/L  Brain  natriuretic peptide     Status: Abnormal   Collection Time: 05/12/21  6:33 PM  Result Value Ref Range   B Natriuretic Peptide 318.4 (H) 0.0 - 100.0 pg/mL  D-dimer, quantitative     Status: Abnormal   Collection Time: 05/12/21  6:33 PM  Result Value Ref Range   D-Dimer, Quant 3.94 (H) 0.00 - 0.50 ug/mL-FEU  Type and screen [facility]     Status: None   Collection Time: 05/12/21 10:56 PM  Result Value Ref Range   ABO/RH(D) A POS    Antibody Screen NEG    Sample Expiration      05/15/2021,2359 Performed at [facility], [address] [place], [place] [address]     Treatments: IV lasix, oral anti-hypertensives  Discharge Exam: Blood pressure (!) 143/81, pulse 79, temperature 97.8 F (36.6 C), temperature source Oral, resp. rate 18, SpO2 99 %, not currently breastfeeding. Physical Exam:  General: alert, cooperative and no distress Chest: no respiratory distress Heart: regular rate and rhythm Abdomen: soft, nontender, +BS Uterine Fundus: firm,  appropriately tender Incision: C/D/I- well healed DVT Evaluation: No calf swelling or tenderness Extremities: 2+ pitting edema Skin: warm, dry  Disposition: Discharge disposition: 01-Home or Self Care        Allergies as of 05/13/2021       Reactions   Dust Mite Extract Shortness Of Breath   Grass, Cats, weeds (Requires Epipen)   Pollen Extract Anaphylaxis, Hives   Cat Hair Extract    Sniffles        Medication List     TAKE these medications    amLODipine 5 MG tablet Commonly known as: NORVASC Take 1 tablet (5 mg total) by mouth daily. Start taking on: May 14, 2021   Anusol-HC 2.5 % rectal cream Generic drug: hydrocortisone Place 1 application rectally 4 (four) times daily as needed for hemorrhoids or anal itching.   ARIPiprazole 20 MG tablet Commonly known as: ABILIFY Take 20 mg by mouth at bedtime.   furosemide 20 MG tablet Commonly known as: LASIX Take 1 tablet (20 mg total) by mouth 2 (two) times daily for 5 days.   lamoTRIgine 25 MG tablet Commonly known as: LAMICTAL Take 50 mg by mouth daily.   melatonin 5 MG Tabs Take 5 mg by mouth at bedtime.   multivitamin with minerals Tabs tablet Take 1 tablet by mouth daily.        Follow-up Information     [facility] for [facility] Follow up.   Specialty: Obstetrics and Gynecology Why: Please follow up either at our office or with your primary [facility] physician in one week. Contact information: [address] [place] [address] [phone]                Signed: Sharon Seller 05/13/2021, 1:49 PM

## 2021-05-13 NOTE — Progress Notes (Signed)
POSTPARTUM PROGRESS NOTE  Postpartum admit- PPD#6  Subjective:  Charice Yuliana Vandrunen is a 35 y.o. G1P1001 s/p pLTCS on 12/25 at outside facility- readmitted due to SOB with known cHTN and superimposed preeclampsia  Today she notes that she is starting to feel better- denies SOB at rest.  Notes only occasional SOB with significant activity. She denies any problems with ambulating, voiding or po intake. Denies nausea or vomiting. She has + flatus, +BM.  Pain is minimal.  Lochia minimal Denies fever/chills/chest pain. no HA, no blurry vision, noRUQ pain  Objective: Blood pressure (!) 141/72, pulse 67, temperature 98.3 F (36.8 C), temperature source Oral, resp. rate 18, SpO2 98 %, not currently breastfeeding.  Physical Exam:  General: alert, cooperative and no distress Chest: no respiratory distress Heart: regular rate and rhythm Abdomen: soft, nontender, +BS Uterine Fundus: firm, appropriately tender Incision: C/D/I- well healed DVT Evaluation: No calf swelling or tenderness Extremities: 2+ pitting edema Skin: warm, dry  Results for orders placed or performed during the hospital encounter of 05/12/21 (from the past 24 hour(s))  Resp Panel by RT-PCR (Flu A&B, Covid) Nasopharyngeal Swab     Status: None   Collection Time: 05/12/21  6:31 PM   Specimen: Nasopharyngeal Swab; Nasopharyngeal(NP) swabs in vial transport medium  Result Value Ref Range   SARS Coronavirus 2 by RT PCR NEGATIVE NEGATIVE   Influenza A by PCR NEGATIVE NEGATIVE   Influenza B by PCR NEGATIVE NEGATIVE  CBC with Differential/Platelet     Status: Abnormal   Collection Time: 05/12/21  6:33 PM  Result Value Ref Range   WBC 9.8 4.0 - 10.5 K/uL   RBC 3.06 (L) 3.87 - 5.11 MIL/uL   Hemoglobin 8.9 (L) 12.0 - 15.0 g/dL   HCT 54.2 (L) 70.6 - 23.7 %   MCV 92.5 80.0 - 100.0 fL   MCH 29.1 26.0 - 34.0 pg   MCHC 31.4 30.0 - 36.0 g/dL   RDW 62.8 31.5 - 17.6 %   Platelets 371 150 - 400 K/uL   nRBC 0.4 (H) 0.0 - 0.2 %    Neutrophils Relative % 71 %   Neutro Abs 6.9 1.7 - 7.7 K/uL   Lymphocytes Relative 18 %   Lymphs Abs 1.8 0.7 - 4.0 K/uL   Monocytes Relative 5 %   Monocytes Absolute 0.5 0.1 - 1.0 K/uL   Eosinophils Relative 2 %   Eosinophils Absolute 0.2 0.0 - 0.5 K/uL   Basophils Relative 0 %   Basophils Absolute 0.0 0.0 - 0.1 K/uL   Immature Granulocytes 4 %   Abs Immature Granulocytes 0.38 (H) 0.00 - 0.07 K/uL  Comprehensive metabolic panel     Status: Abnormal   Collection Time: 05/12/21  6:33 PM  Result Value Ref Range   Sodium 140 135 - 145 mmol/L   Potassium 3.8 3.5 - 5.1 mmol/L   Chloride 110 98 - 111 mmol/L   CO2 23 22 - 32 mmol/L   Glucose, Bld 86 70 - 99 mg/dL   BUN 9 6 - 20 mg/dL   Creatinine, Ser 1.60 0.44 - 1.00 mg/dL   Calcium 8.1 (L) 8.9 - 10.3 mg/dL   Total Protein 5.8 (L) 6.5 - 8.1 g/dL   Albumin 2.5 (L) 3.5 - 5.0 g/dL   AST 40 15 - 41 U/L   ALT 45 (H) 0 - 44 U/L   Alkaline Phosphatase 84 38 - 126 U/L   Total Bilirubin 0.4 0.3 - 1.2 mg/dL   GFR, Estimated >73 >71  mL/min   Anion gap 7 5 - 15  Troponin I (High Sensitivity)     Status: None   Collection Time: 05/12/21  6:33 PM  Result Value Ref Range   Troponin I (High Sensitivity) 15 <18 ng/L  Brain natriuretic peptide     Status: Abnormal   Collection Time: 05/12/21  6:33 PM  Result Value Ref Range   B Natriuretic Peptide 318.4 (H) 0.0 - 100.0 pg/mL  D-dimer, quantitative     Status: Abnormal   Collection Time: 05/12/21  6:33 PM  Result Value Ref Range   D-Dimer, Quant 3.94 (H) 0.00 - 0.50 ug/mL-FEU  Type and screen [facility]     Status: None   Collection Time: 05/12/21 10:56 PM  Result Value Ref Range   ABO/RH(D) A POS    Antibody Screen NEG    Sample Expiration      05/15/2021,2359 Performed at [facility], [address] [place], [place] [address]     Assessment/Plan: Nimrat Woolworth is a 35 y.o. G1P1001 admitted due to SOB 1) cHTN with superimposed preeclampsia  -s/p IV  Lasix 40mg  x 1 -currently on Lasix 20mg  po bid x 5 days  -Norvasc 5mg  daily ordered 2) SOB  -improving  -cardiology consult  -complete ECHO to be done today 3) Bipolar disorder, Anxiety -continue home medication- Lamictal, Abilify   Dispo: Pending ECHO- possible discharge home with outpatient follow up.   LOS: 0 days   , DO Faculty Attending, Center for [facility] 05/13/2021, 10:43 AM

## 2021-05-13 NOTE — Plan of Care (Signed)
Problem: Education: Goal: Knowledge of General Education information will improve Description: Including pain rating scale, medication(s)/side effects and non-pharmacologic comfort measures Outcome: Completed/Met   Problem: Health Behavior/Discharge Planning: Goal: Ability to manage health-related needs will improve Outcome: Completed/Met   Problem: Clinical Measurements: Goal: Ability to maintain clinical measurements within normal limits will improve Outcome: Completed/Met Goal: Will remain free from infection Outcome: Completed/Met Goal: Diagnostic test results will improve Outcome: Completed/Met Goal: Respiratory complications will improve Outcome: Completed/Met Goal: Cardiovascular complication will be avoided Outcome: Completed/Met   Problem: Activity: Goal: Risk for activity intolerance will decrease Outcome: Completed/Met   Problem: Nutrition: Goal: Adequate nutrition will be maintained Outcome: Completed/Met   Problem: Coping: Goal: Level of anxiety will decrease Outcome: Completed/Met   Problem: Elimination: Goal: Will not experience complications related to bowel motility Outcome: Completed/Met Goal: Will not experience complications related to urinary retention Outcome: Completed/Met   Problem: Pain Managment: Goal: General experience of comfort will improve Outcome: Completed/Met   Problem: Safety: Goal: Ability to remain free from injury will improve Outcome: Completed/Met   Problem: Skin Integrity: Goal: Risk for impaired skin integrity will decrease Outcome: Completed/Met   Problem: Education: Goal: Knowledge of disease or condition will improve Outcome: Completed/Met Goal: Knowledge of the prescribed therapeutic regimen will improve Outcome: Completed/Met   Problem: Fluid Volume: Goal: Peripheral tissue perfusion will improve Outcome: Completed/Met   Problem: Clinical Measurements: Goal: Complications related to disease process,  condition or treatment will be avoided or minimized Outcome: Completed/Met

## 2021-05-13 NOTE — Progress Notes (Signed)
°  Echocardiogram 2D Echocardiogram has been performed.  Roosvelt Maser F 05/13/2021, 11:50 AM

## 2021-05-13 NOTE — Progress Notes (Addendum)
DAILY PROGRESS NOTE   Patient Name: Corey Harold Mangrum Date of Encounter: 05/13/2021 Cardiologist: None  Chief Complaint   No complaints  Patient Profile   Johniece Hornbaker is a 35 y.o. female with a hx of pre-eclampsia and Gestational DM who is 5 days post delivery (C-section) who is being seen 05/12/2021 for the evaluation of SOB, LE edema, fluid overload at the request of Dr Donavan Foil.  Subjective   Seen last evening by Dr. Brayton Layman for volume overload - bedside echo did not suggest CHF. Formal echo today showed normal systolic and diastolic function. BP elevated and has LE edema. Delivered a baby boy named Joelene Millin.  Objective   Vitals:   05/12/21 2155 05/12/21 2205 05/13/21 0505 05/13/21 1212  BP:  (!) 144/90 (!) 141/72 (!) 143/81  Pulse:  70 67 79  Resp:  Temp:  98.1 F (36.7 C) 98.3 F (36.8 C) 97.8 F (36.6 C)  TempSrc:  Oral Oral Oral  SpO2: 98%  98% 99%    Intake/Output Summary (Last 24 hours) at 05/13/2021 1339 Last data filed at 05/13/2021 0300 Gross per 24 hour  Intake 80 ml  Output 1000 ml  Net -920 ml   There were no vitals filed for this visit.  Physical Exam   General appearance: alert and no distress Lungs: clear to auscultation bilaterally Heart: regular rate and rhythm, S1, S2 normal, no murmur, click, rub or gallop Extremities: edema 2+ bilateral LE edema Neurologic: Grossly normal  Inpatient Medications    Scheduled Meds:  amLODipine  5 mg Oral Daily   ARIPiprazole  20 mg Oral QHS   docusate sodium  100 mg Oral Daily   furosemide  20 mg Oral BID   lamoTRIgine  50 mg Oral Daily    Continuous Infusions:   PRN Meds: acetaminophen, calcium carbonate, hydrocortisone   Labs   Results for orders placed or performed during the hospital encounter of 05/12/21 (from the past 48 hour(s))  Resp Panel by RT-PCR (Flu A&B, Covid) Nasopharyngeal Swab     Status: None   Collection Time: 05/12/21  6:31 PM   Specimen:  Nasopharyngeal Swab; Nasopharyngeal(NP) swabs in vial transport medium  Result Value Ref Range   SARS Coronavirus 2 by RT PCR NEGATIVE NEGATIVE    Comment: (NOTE) SARS-CoV-2 target nucleic acids are NOT DETECTED.  The SARS-CoV-2 RNA is generally detectable in upper respiratory specimens during the acute phase of infection. The lowest concentration of SARS-CoV-2 viral copies this assay can detect is 138 copies/mL. A negative result does not preclude SARS-Cov-2 infection and should not be used as the sole basis for treatment or other patient management decisions. A negative result may occur with  improper specimen collection/handling, submission of specimen other than nasopharyngeal swab, presence of viral mutation(s) within the areas targeted by this assay, and inadequate number of viral copies(<138 copies/mL). A negative result must be combined with clinical observations, patient history, and epidemiological information. The expected result is Negative.  Fact Sheet for Patients:  BloggerCourse.com  Fact Sheet for Healthcare Providers:  SeriousBroker.it  This test is no t yet approved or cleared by the [place] FDA and  has been authorized for detection and/or diagnosis of SARS-CoV-2 by FDA under an Emergency Use Authorization (EUA). This EUA will remain  in effect (meaning this test can be used) for the duration of the COVID-19 declaration under Section 564(b)(1) of the Act, 21 U.S.C.section 360bbb-3(b)(1), unless the authorization is terminated  or revoked sooner.  Influenza A by PCR NEGATIVE NEGATIVE   Influenza B by PCR NEGATIVE NEGATIVE    Comment: (NOTE) The Xpert Xpress SARS-CoV-2/FLU/RSV plus assay is intended as an aid in the diagnosis of influenza from Nasopharyngeal swab specimens and should not be used as a sole basis for treatment. Nasal washings and aspirates are unacceptable for Xpert Xpress  SARS-CoV-2/FLU/RSV testing.  Fact Sheet for Patients: BloggerCourse.com  Fact Sheet for Healthcare Providers: SeriousBroker.it  This test is not yet approved or cleared by the [place] FDA and has been authorized for detection and/or diagnosis of SARS-CoV-2 by FDA under an Emergency Use Authorization (EUA). This EUA will remain in effect (meaning this test can be used) for the duration of the COVID-19 declaration under Section 564(b)(1) of the Act, 21 U.S.C. section 360bbb-3(b)(1), unless the authorization is terminated or revoked.  Performed at [facility], [address], [place], [place] [address]   CBC with Differential/Platelet     Status: Abnormal   Collection Time: 05/12/21  6:33 PM  Result Value Ref Range   WBC 9.8 4.0 - 10.5 K/uL   RBC 3.06 (L) 3.87 - 5.11 MIL/uL   Hemoglobin 8.9 (L) 12.0 - 15.0 g/dL   HCT 60.4 (L) 54.0 - 98.1 %   MCV 92.5 80.0 - 100.0 fL   MCH 29.1 26.0 - 34.0 pg   MCHC 31.4 30.0 - 36.0 g/dL   RDW 19.1 47.8 - 29.5 %   Platelets 371 150 - 400 K/uL   nRBC 0.4 (H) 0.0 - 0.2 %   Neutrophils Relative % 71 %   Neutro Abs 6.9 1.7 - 7.7 K/uL   Lymphocytes Relative 18 %   Lymphs Abs 1.8 0.7 - 4.0 K/uL   Monocytes Relative 5 %   Monocytes Absolute 0.5 0.1 - 1.0 K/uL   Eosinophils Relative 2 %   Eosinophils Absolute 0.2 0.0 - 0.5 K/uL   Basophils Relative 0 %   Basophils Absolute 0.0 0.0 - 0.1 K/uL   Immature Granulocytes 4 %   Abs Immature Granulocytes 0.38 (H) 0.00 - 0.07 K/uL    Comment: Performed at [facility], [address], [place], [place] [address]  Comprehensive metabolic panel     Status: Abnormal   Collection Time: 05/12/21  6:33 PM  Result Value Ref Range   Sodium 140 135 - 145 mmol/L   Potassium 3.8 3.5 - 5.1 mmol/L   Chloride 110 98 - 111 mmol/L   CO2 23 22 - 32 mmol/L   Glucose, Bld 86 70 - 99 mg/dL    Comment: Glucose reference range applies only to samples taken  after fasting for at least 8 hours.   BUN 9 6 - 20 mg/dL   Creatinine, Ser 8.65 0.44 - 1.00 mg/dL   Calcium 8.1 (L) 8.9 - 10.3 mg/dL   Total Protein 5.8 (L) 6.5 - 8.1 g/dL   Albumin 2.5 (L) 3.5 - 5.0 g/dL   AST 40 15 - 41 U/L   ALT 45 (H) 0 - 44 U/L   Alkaline Phosphatase 84 38 - 126 U/L   Total Bilirubin 0.4 0.3 - 1.2 mg/dL   GFR, Estimated >78 >46 mL/min    Comment: (NOTE) Calculated using the CKD-EPI Creatinine Equation (2021)    Anion gap 7 5 - 15    Comment: Performed at [facility], [address], [place], [place] [address]  Troponin I (High Sensitivity)     Status: None   Collection Time: 05/12/21  6:33 PM  Result Value Ref Range   Troponin I (High Sensitivity) 15 <18 ng/L    Comment: (NOTE) Elevated high sensitivity troponin I (hsTnI) values and significant  changes across serial measurements may suggest ACS but many other  chronic and acute conditions are known to elevate hsTnI results.  Refer to the "Links" section for chest pain algorithms and additional  guidance. Performed at [facility], [address], [place], [place] [address]   Brain natriuretic peptide     Status: Abnormal   Collection Time: 05/12/21  6:33 PM  Result Value Ref Range   B Natriuretic Peptide 318.4 (H) 0.0 - 100.0 pg/mL    Comment: Performed at [facility], [address] [place], [place] [address]  D-dimer, quantitative     Status: Abnormal   Collection Time: 05/12/21  6:33 PM  Result Value Ref Range   D-Dimer, Quant 3.94 (H) 0.00 - 0.50 ug/mL-FEU    Comment: (NOTE) At the manufacturer cut-off value of 0.5 g/mL FEU, this assay has a negative predictive value of 95-100%.This assay is intended for use in conjunction with a clinical pretest probability (PTP) assessment model to exclude pulmonary embolism (PE) and deep venous thrombosis (DVT) in outpatients suspected of PE or DVT. Results should be correlated with clinical presentation. Performed at [facility], [address], [place], [place] [address]   Type and screen [facility]     Status: None   Collection Time: 05/12/21 10:56 PM  Result Value Ref Range   ABO/RH(D) A POS    Antibody Screen NEG    Sample Expiration      05/15/2021,2359 Performed at [facility], [address] [place], [place] [address]     ECG   N/A  Telemetry   N/A  Radiology    CT Angio Chest PE W and/or Wo Contrast  Result Date: 05/12/2021 CLINICAL DATA:  Pulmonary embolism (PE) suspected, high prob. CTA Chest PE Study Patient presents with cough, SOB and foot swelling; Patient denies any CP. EXAM: CT ANGIOGRAPHY CHEST WITH CONTRAST TECHNIQUE: Multidetector CT imaging of the chest was performed using the standard protocol during bolus administration of intravenous contrast. Multiplanar CT image reconstructions and MIPs were obtained to evaluate the vascular anatomy. CONTRAST:  86mL OMNIPAQUE IOHEXOL 350 MG/ML SOLN COMPARISON:  None. FINDINGS: Cardiovascular: Satisfactory opacification of the pulmonary arteries to the segmental level. No evidence of pulmonary embolism. Normal heart size. No pericardial effusion. Mediastinum/Nodes: No enlarged mediastinal, hilar, or axillary lymph nodes. Thyroid gland, trachea, and esophagus demonstrate no significant findings. Lungs/Pleura: Diffuse mild peribronchovascular ground-glass airspace opacities. Bilateral lower lobe atelectasis and interlobular septal wall thickening. No focal consolidation. No pulmonary nodule. No pulmonary mass. Trace right pleural effusion. No definite left pleural effusion no pneumothorax. Upper Abdomen: No acute abnormality. Musculoskeletal: No chest wall abnormality. No suspicious lytic or blastic osseous lesions. No acute displaced fracture. Multilevel degenerative changes of the spine. Review of the MIP images confirms the above findings. IMPRESSION: 1. No pulmonary embolus. 2. Diffuse mild peribronchovascular ground-glass  airspace opacities. Findings may represent pulmonary edema versus infection/inflammation. 3. Trace right pleural effusion. Electronically Signed   By: Tish Frederickson M.D.   On: 05/12/2021 20:54   DG CHEST PORT 1 VIEW  Result Date: 05/12/2021 CLINICAL DATA:  Shortness of breath EXAM: PORTABLE CHEST 1 VIEW COMPARISON:  04/04/2013 FINDINGS: Transverse diameter of heart is increased. Central pulmonary vessels are more prominent. There is slight increase in  interstitial markings in the parahilar regions and lower lung fields. There is patchy infiltrate in medial left lower lung fields. There is minimal blunting of right lateral CP angle. There is no pneumothorax. IMPRESSION: Cardiomegaly. Central pulmonary vessels are more prominent. Increased interstitial markings are seen in the parahilar regions and lower lung fields suggesting interstitial edema or interstitial pneumonia. There is patchy infiltrate in the medial left lower lung fields suggesting atelectasis/pneumonia. Electronically Signed   By: Ernie Avena M.D.   On: 05/12/2021 18:53   ECHOCARDIOGRAM COMPLETE  Result Date: 05/13/2021    ECHOCARDIOGRAM REPORT   Patient Name:   ATALIA LITZINGER Date of Exam: 05/13/2021 Medical Rec #:  [id number]             Height:       64.0 in Accession #:    [id number]            Weight:       167.0 lb Date of Birth:  November 05, 1985            BSA:          1.812 m Patient Age:    35 years              BP:           141/72 mmHg Patient Gender: F                     HR:           59 bpm. Exam Location:  Inpatient Procedure: 2D Echo, Cardiac Doppler and Color Doppler Indications:    Cardiomegaly I51.7  History:        Patient has no prior history of Echocardiogram examinations. 6                 days post-partum. Pre-eclampsia.  Sonographer:    Roosvelt Maser RDCS Referring Phys: [phone] CAROLINE M NEILL IMPRESSIONS  1. Left ventricular ejection fraction, by estimation, is 55 to 60%. The left ventricle has normal  function. The left ventricle has no regional wall motion abnormalities. Left ventricular diastolic parameters were normal.  2. Right ventricular systolic function is normal. The right ventricular size is normal.  3. The mitral valve is abnormal. Mild mitral valve regurgitation. No evidence of mitral stenosis.  4. Mild calcification of the non coronary cusp. The aortic valve is tricuspid. Aortic valve regurgitation is trivial. No aortic stenosis is present.  5. The inferior vena cava is normal in size with greater than 50% respiratory variability, suggesting right atrial pressure of 3 mmHg. FINDINGS  Left Ventricle: Left ventricular ejection fraction, by estimation, is 55 to 60%. The left ventricle has normal function. The left ventricle has no regional wall motion abnormalities. The left ventricular internal cavity size was normal in size. There is  no left ventricular hypertrophy. Left ventricular diastolic parameters were normal. Right Ventricle: The right ventricular size is normal. No increase in right ventricular wall thickness. Right ventricular systolic function is normal. Left Atrium: Left atrial size was normal in size. Right Atrium: Right atrial size was normal in size. Pericardium: There is no evidence of pericardial effusion. Mitral Valve: The mitral valve is abnormal. There is mild thickening of the mitral valve leaflet(s). There is mild calcification of the mitral valve leaflet(s). Mild mitral valve regurgitation. No evidence of mitral valve stenosis. Tricuspid Valve: The tricuspid valve is normal in structure. Tricuspid valve regurgitation is not demonstrated. No evidence of tricuspid stenosis. Aortic Valve: Mild  calcification of the non coronary cusp. The aortic valve is tricuspid. Aortic valve regurgitation is trivial. No aortic stenosis is present. Aortic valve mean gradient measures 6.0 mmHg. Aortic valve peak gradient measures 12.0 mmHg. Aortic valve area, by VTI measures 2.20 cm. Pulmonic  Valve: The pulmonic valve was normal in structure. Pulmonic valve regurgitation is not visualized. No evidence of pulmonic stenosis. Aorta: The aortic root is normal in size and structure. Venous: The inferior vena cava is normal in size with greater than 50% respiratory variability, suggesting right atrial pressure of 3 mmHg. IAS/Shunts: No atrial level shunt detected by color flow Doppler.  LEFT VENTRICLE PLAX 2D LVIDd:         4.90 cm   Diastology LVIDs:         3.10 cm   LV e' medial:    13.70 cm/s LV PW:         1.20 cm   LV E/e' medial:  10.1 LV IVS:        0.90 cm   LV e' lateral:   19.10 cm/s LVOT diam:     2.00 cm   LV E/e' lateral: 7.2 LV SV:         85 LV SV Index:   47 LVOT Area:     3.14 cm  RIGHT VENTRICLE          IVC RV Basal diam:  3.50 cm  IVC diam: 2.00 cm LEFT ATRIUM             Index        RIGHT ATRIUM           Index LA diam:        3.70 cm 2.04 cm/m   RA Area:     24.00 cm LA Vol (A2C):   82.9 ml 45.75 ml/m  RA Volume:   85.40 ml  47.13 ml/m LA Vol (A4C):   56.8 ml 31.35 ml/m LA Biplane Vol: 70.3 ml 38.80 ml/m  AORTIC VALVE AV Area (Vmax):    2.31 cm AV Area (Vmean):   2.14 cm AV Area (VTI):     2.20 cm AV Vmax:           173.00 cm/s AV Vmean:          115.000 cm/s AV VTI:            0.387 m AV Peak Grad:      12.0 mmHg AV Mean Grad:      6.0 mmHg LVOT Vmax:         127.00 cm/s LVOT Vmean:        78.400 cm/s LVOT VTI:          0.271 m LVOT/AV VTI ratio: 0.70  AORTA Ao Root diam: 2.90 cm MITRAL VALVE MV Area (PHT): 3.93 cm     SHUNTS MV Decel Time: 193 msec     Systemic VTI:  0.27 m MV E velocity: 138.00 cm/s  Systemic Diam: 2.00 cm MV A velocity: 65.50 cm/s MV E/A ratio:  2.11 Charlton Haws MD Electronically signed by Charlton Haws MD Signature Date/Time: 05/13/2021/11:57:07 AM    Final     Cardiac Studies   See echo  Assessment   Principal Problem:   Shortness of breath Active Problems:   Cardiomegaly   Postpartum state   Plan   Echo today is reassuring, no signs of  heart failure. There is some mild MR and reported aortic and mitral valve calcifications. I suspect edema  secondary to pre-eclampsia and volume related to c-section. Should spontaneously mobilize, however, lasix will expedite process. Antihypertensive therapy as per OB. Would consider repeat echo in 6-12 months to re-evaluate MR.  Will sign-off. Ok to d/c from our standpoint.  [facility] will sign off.   Medication Recommendations:  as above Other recommendations (labs, testing, etc):  repeat echo in 6-12 months to reassess MR Follow up as an outpatient:  PRN   Time Spent Directly with Patient:  I have spent a total of 25 minutes with the patient reviewing hospital notes, telemetry, EKGs, labs and examining the patient as well as establishing an assessment and plan that was discussed personally with the patient.  > 50% of time was spent in direct patient care.  Length of Stay:  LOS: 0 days   Chrystie Nose, MD, [facility], FACP     [facility]  Medical Director of the [facility]  [facility] Diplomate of the American Board of Clinical Lipidology Attending Cardiologist  Direct Dial: [phone]   Fax: [phone]  Website:  [email or web address] Nicely Korin Setzler 05/13/2021, 1:39 PM

## 2021-05-14 DIAGNOSIS — Z419 Encounter for procedure for purposes other than remedying health state, unspecified: Secondary | ICD-10-CM | POA: Diagnosis not present

## 2021-06-14 DIAGNOSIS — Z419 Encounter for procedure for purposes other than remedying health state, unspecified: Secondary | ICD-10-CM | POA: Diagnosis not present

## 2021-07-12 DIAGNOSIS — Z419 Encounter for procedure for purposes other than remedying health state, unspecified: Secondary | ICD-10-CM | POA: Diagnosis not present

## 2021-08-12 DIAGNOSIS — Z419 Encounter for procedure for purposes other than remedying health state, unspecified: Secondary | ICD-10-CM | POA: Diagnosis not present

## 2021-09-11 DIAGNOSIS — Z419 Encounter for procedure for purposes other than remedying health state, unspecified: Secondary | ICD-10-CM | POA: Diagnosis not present

## 2021-11-09 ENCOUNTER — Emergency Department (HOSPITAL_BASED_OUTPATIENT_CLINIC_OR_DEPARTMENT_OTHER)
Admission: EM | Admit: 2021-11-09 | Discharge: 2021-11-10 | Disposition: A | Discharge status: Home / Self Care | Payer: PRIVATE HEALTH INSURANCE | Attending: Emergency Medicine | Admitting: Emergency Medicine

## 2021-11-09 ENCOUNTER — Encounter (HOSPITAL_BASED_OUTPATIENT_CLINIC_OR_DEPARTMENT_OTHER): Payer: Self-pay | Admitting: Emergency Medicine

## 2021-11-09 ENCOUNTER — Other Ambulatory Visit: Payer: Self-pay

## 2021-11-09 DIAGNOSIS — E860 Dehydration: Secondary | ICD-10-CM

## 2021-11-09 DIAGNOSIS — R5381 Other malaise: Secondary | ICD-10-CM | POA: Insufficient documentation

## 2021-11-09 DIAGNOSIS — R350 Frequency of micturition: Secondary | ICD-10-CM | POA: Diagnosis present

## 2021-11-09 LAB — CBC
HCT: 44.9 % (ref 36.0–46.0)
Hemoglobin: 15.3 g/dL — ABNORMAL HIGH (ref 12.0–15.0)
MCH: 30.8 pg (ref 26.0–34.0)
MCHC: 34.1 g/dL (ref 30.0–36.0)
MCV: 90.5 fL (ref 80.0–100.0)
Platelets: 171 10*3/uL (ref 150–400)
RBC: 4.96 MIL/uL (ref 3.87–5.11)
RDW: 12.3 % (ref 11.5–15.5)
WBC: 8.5 10*3/uL (ref 4.0–10.5)
nRBC: 0 % (ref 0.0–0.2)

## 2021-11-09 LAB — BASIC METABOLIC PANEL
Anion gap: 11 (ref 5–15)
BUN: 16 mg/dL (ref 6–20)
CO2: 23 mmol/L (ref 22–32)
Calcium: 10.2 mg/dL (ref 8.9–10.3)
Chloride: 100 mmol/L (ref 98–111)
Creatinine, Ser: 1.08 mg/dL — ABNORMAL HIGH (ref 0.44–1.00)
GFR, Estimated: >60 mL/min (ref >60)
Glucose, Bld: 87 mg/dL (ref 70–99)
Potassium: 3.4 mmol/L — ABNORMAL LOW (ref 3.5–5.1)
Sodium: 134 mmol/L — ABNORMAL LOW (ref 135–145)

## 2021-11-09 LAB — URINALYSIS, ROUTINE W REFLEX MICROSCOPIC
Bilirubin Urine: NEGATIVE
Glucose, UA: NEGATIVE mg/dL
Hgb urine dipstick: NEGATIVE
Ketones, ur: NEGATIVE mg/dL
Leukocytes,Ua: NEGATIVE
Nitrite: NEGATIVE
Protein, ur: 30 mg/dL — AB
Specific Gravity, Urine: 1.032 — ABNORMAL HIGH (ref 1.005–1.030)
pH: 5.5 (ref 5.0–8.0)

## 2021-11-09 MED ORDER — ONDANSETRON 4 MG PO TBDP
4.0000 mg | ORAL_TABLET | Freq: Once | ORAL | Status: DC | PRN
  Filled 2021-11-09: qty 1

## 2021-11-09 NOTE — ED Triage Notes (Signed)
Pt seen at [facility] today, was told she needs "Iv fluids and antibiotics for a bladder infection or a kidney infection."  Pt reports difficulty with urination, denies dysuria. Also c/o dull lower back pain since 0400 today.

## 2021-11-10 MED ORDER — SODIUM CHLORIDE 0.9 % IV BOLUS
1000.0000 mL | Freq: Once | INTRAVENOUS | Status: AC
  Administered 2021-11-10: 1000 mL via INTRAVENOUS

## 2021-11-10 NOTE — ED Provider Notes (Signed)
[facility] Provider Note: Lowella Dell, MD, FACEP  CSN: [id number] MRN: [id number] ARRIVAL: 11/09/21 at 1946 ROOM: DB015/DB015   CHIEF COMPLAINT  Urinary Frequency   HISTORY OF PRESENT ILLNESS  11/10/21 1:54 AM [name] is a 36 y.o. female with several days of urinary frequency and urgency.  She is having no burning with urination.  She feels like she is voiding small amounts and not completely emptying her bladder.  Her urine has been darker than usual.  Since yesterday morning she has had a dull ache in her back.  She rates this is a 6 out of 10.  It is poorly localized but seems to be more present on the left side.  It is not worse with movement or palpation.  She has not had a fever or chills.  She was seen at an urgent care yesterday and told she needed "IV fluids and antibiotics for a bladder infection or kidney infection".  She has had some general malaise as well.   Past Medical History:  Diagnosis Date   Anxiety    Bipolar disorder (HCC)    Deep vein blood clot of left lower extremity (HCC)    Family history of breast cancer    Genetic testing 05/03/2017   Common Cancers panel (47 genes) @ [facility] - No pathogenic mutations detected   Hypercholesterolemia    Myalgia    Seasonal allergies    Vitamin D deficiency     Past Surgical History:  Procedure Laterality Date   CESAREAN SECTION     COLONOSCOPY  02/02/2019   [facility]   SALPINGECTOMY Bilateral    TONSILLECTOMY AND ADENOIDECTOMY      Family History  Problem Relation Age of Onset   Cancer Mother    Ovarian cancer Mother        stage 4    Breast cancer Mother        double mastectomy in 2019   Prostate cancer Paternal Uncle        deceased 36s   Prostate cancer Paternal Uncle        deceased 24s   Colon cancer Paternal Uncle    Breast cancer Maternal Grandmother 45       2nd primary vs. recurrence in 47s   Breast cancer Paternal Grandmother    Colon cancer Paternal  Grandfather    Breast cancer Cousin    Breast cancer Cousin    Breast cancer Other        mother's paternal first cousin; dx 38s   Breast cancer Other        paternal grandmother's sister; currently 13s   Esophageal cancer Neg Hx     Social History   Tobacco Use   Smoking status: Never   Smokeless tobacco: Never  Vaping Use   Vaping Use: Never used  Substance Use Topics   Alcohol use: Yes    Alcohol/week: 2.0 standard drinks of alcohol    Types: 1 Glasses of wine, 1 Cans of beer per week    Comment: occasionally   Drug use: No    Prior to Admission medications   Medication Sig Start Date End Date Taking? Authorizing Provider  amLODipine (NORVASC) 5 MG tablet Take 1 tablet (5 mg total) by mouth daily. 05/14/21 06/13/21  Myna Hidalgo, DO  ARIPiprazole (ABILIFY) 20 MG tablet Take 20 mg by mouth at bedtime.     [provider]  furosemide (LASIX) 20 MG tablet Take 1 tablet (20 mg  total) by mouth 2 (two) times daily for 5 days. 05/13/21 05/18/21  Janyth Pupa, DO  hydrocortisone (ANUSOL-HC) 2.5 % rectal cream Place 1 application rectally 4 (four) times daily as needed for hemorrhoids or anal itching.    [provider]  lamoTRIgine (LAMICTAL) 25 MG tablet Take 50 mg by mouth daily.    [provider]  Melatonin 5 MG TABS Take 5 mg by mouth at bedtime.    [provider]  Multiple Vitamin (MULTIVITAMIN WITH MINERALS) TABS tablet Take 1 tablet by mouth daily.    [provider]    Allergies Dust mite extract, Pollen extract, and Cat hair extract   REVIEW OF SYSTEMS  Negative except as noted here or in the History of Present Illness.   PHYSICAL EXAMINATION  Initial Vital Signs Blood pressure 122/82, pulse 79, temperature 98.2 F (36.8 C), resp. rate 16, height 5\' 4"  (1.626 m), weight 77.1 kg, SpO2 100 %, not currently breastfeeding.  Examination General: Well-developed, well-nourished female in no acute distress; appearance  consistent with age of record HENT: normocephalic; atraumatic Eyes: Normal appearing Neck: supple Heart: regular rate and rhythm Lungs: clear to auscultation bilaterally Abdomen: soft; nondistended; nontender; bowel sounds present GU: No CVA tenderness Extremities: No deformity; full range of motion; pulses normal Neurologic: Awake, alert and oriented; motor function intact in all extremities and symmetric; no facial droop Skin: Warm and dry Psychiatric: Normal mood and affect   RESULTS  Summary of this visit's results, reviewed and interpreted by myself:   EKG Interpretation  Date/Time:    Ventricular Rate:    PR Interval:    QRS Duration:   QT Interval:    QTC Calculation:   R Axis:     Text Interpretation:         Laboratory Studies: Results for orders placed or performed during the hospital encounter of 11/09/21 (from the past 24 hour(s))  Urinalysis, Routine w reflex microscopic     Status: Abnormal   Collection Time: 11/09/21  8:01 PM  Result Value Ref Range   Color, Urine YELLOW YELLOW   APPearance HAZY (A) CLEAR   Specific Gravity, Urine 1.032 (H) 1.005 - 1.030   pH 5.5 5.0 - 8.0   Glucose, UA NEGATIVE NEGATIVE mg/dL   Hgb urine dipstick NEGATIVE NEGATIVE   Bilirubin Urine NEGATIVE NEGATIVE   Ketones, ur NEGATIVE NEGATIVE mg/dL   Protein, ur 30 (A) NEGATIVE mg/dL   Nitrite NEGATIVE NEGATIVE   Leukocytes,Ua NEGATIVE NEGATIVE   WBC, UA 0-5 0 - 5 WBC/hpf   Squamous Epithelial / LPF 6-10 0 - 5   Mucus PRESENT   Basic metabolic panel     Status: Abnormal   Collection Time: 11/09/21  9:19 PM  Result Value Ref Range   Sodium 134 (L) 135 - 145 mmol/L   Potassium 3.4 (L) 3.5 - 5.1 mmol/L   Chloride 100 98 - 111 mmol/L   CO2 23 22 - 32 mmol/L   Glucose, Bld 87 70 - 99 mg/dL   BUN 16 6 - 20 mg/dL   Creatinine, Ser 1.08 (H) 0.44 - 1.00 mg/dL   Calcium 10.2 8.9 - 10.3 mg/dL   GFR, Estimated >60 >60 mL/min   Anion gap 11 5 - 15  CBC     Status: Abnormal    Collection Time: 11/09/21  9:19 PM  Result Value Ref Range   WBC 8.5 4.0 - 10.5 K/uL   RBC 4.96 3.87 - 5.11 MIL/uL   Hemoglobin 15.3 (H)  12.0 - 15.0 g/dL   HCT 57.2 62.0 - 35.5 %   MCV 90.5 80.0 - 100.0 fL   MCH 30.8 26.0 - 34.0 pg   MCHC 34.1 30.0 - 36.0 g/dL   RDW 97.4 16.3 - 84.5 %   Platelets 171 150 - 400 K/uL   nRBC 0.0 0.0 - 0.2 %   Imaging Studies: No results found.  ED COURSE and MDM  Nursing notes, initial and subsequent vitals signs, including pulse oximetry, reviewed and interpreted by myself.  Vitals:   11/09/21 1956 11/09/21 2331 11/10/21 0030 11/10/21 0200  BP:  120/89 122/82 138/83  Pulse:  90 79 92  Resp:  17 16 17   Temp:      SpO2:  100% 100% 100%  Weight: 77.1 kg     Height: 5\' 4"  (1.626 m)      Medications  ondansetron (ZOFRAN-ODT) disintegrating tablet 4 mg (has no administration in time range)  sodium chloride 0.9 % bolus 1,000 mL (1,000 mLs Intravenous New Bag/Given 11/10/21 0208)   2:02 AM The patient's urinalysis is consistent with dehydration (specific gravity 1.032).  This is consistent with the urinary darkness she has noticed.  No pyuria, hematuria or bacteriuria is noted on urinalysis.  This is inconsistent with a urinary tract infection.  Urine has been sent for culture.  We will give her a liter of normal saline to rehydrate her.  The cause of her dehydration is unclear.  She is not hypoglycemic.  She has not been working in the sun.  She has not been exercising or overexerting herself.  She works in her office.   PROCEDURES  Procedures   ED DIAGNOSES     ICD-10-CM   1. Dehydration  E86.0          Leory Allinson, MD 11/10/21 [phone]

## 2021-11-11 LAB — URINE CULTURE: Culture: <10000 — AB

## 2021-11-13 ENCOUNTER — Other Ambulatory Visit: Payer: Self-pay

## 2021-11-13 ENCOUNTER — Emergency Department (HOSPITAL_COMMUNITY)
Admission: EM | Admit: 2021-11-13 | Discharge: 2021-11-13 | Discharge status: Against Medical Advice | Payer: PRIVATE HEALTH INSURANCE | Attending: Emergency Medicine | Admitting: Emergency Medicine

## 2021-11-13 ENCOUNTER — Encounter (HOSPITAL_COMMUNITY): Payer: Self-pay | Admitting: Pharmacy Technician

## 2021-11-13 DIAGNOSIS — R197 Diarrhea, unspecified: Secondary | ICD-10-CM | POA: Insufficient documentation

## 2021-11-13 DIAGNOSIS — R1011 Right upper quadrant pain: Secondary | ICD-10-CM | POA: Insufficient documentation

## 2021-11-13 DIAGNOSIS — R700 Elevated erythrocyte sedimentation rate: Secondary | ICD-10-CM | POA: Insufficient documentation

## 2021-11-13 DIAGNOSIS — Z5321 Procedure and treatment not carried out due to patient leaving prior to being seen by health care provider: Secondary | ICD-10-CM | POA: Diagnosis not present

## 2021-11-13 DIAGNOSIS — R1031 Right lower quadrant pain: Secondary | ICD-10-CM | POA: Insufficient documentation

## 2021-11-13 LAB — POC URINE PREG, ED: Preg Test, Ur: NEGATIVE

## 2021-11-13 NOTE — ED Triage Notes (Signed)
Pt here with potassium of 3.0. pt also complains of RUQ abdominal pain along with diarrhea.

## 2021-11-13 NOTE — ED Provider Triage Note (Signed)
Emergency Medicine Provider Triage Evaluation Note  [name] , a 36 y.o. female  was evaluated in triage.  Pt complains of abdominal pain since Thursday with severe diarrhea, as well as right upper quadrant and right lower quadrant pain.  Patient was sent from urgent care for CT scan as well as for potassium 3.0.  Very minimally elevated ESR 33.  Lab work is in the system.  Patient denies any dysuria, fever, chills.  Endorses 1 episode of incontinence on Friday.  Review of Systems  Positive: Abdominal pain, diarrhea Negative: Fever, chills  Physical Exam  BP (!) 155/94 (BP Location: Right Arm)   Pulse (!) 109   Temp 98.6 F (37 C) (Oral)   Resp 16   SpO2 92%  Gen:   Awake, no distress   Resp:  Normal effort  MSK:   Moves extremities without difficulty  Other:  Tender to palpation in the right mid-lower quadrant, no rebound, rigidity, guarding.  Medical Decision Making  Medically screening exam initiated at 5:48 PM.  Appropriate orders placed.  [name] was informed that the remainder of the evaluation will be completed by another provider, this initial triage assessment does not replace that evaluation, and the importance of remaining in the ED until their evaluation is complete.  Workup initiated   [name], [place] 11/13/21 1750

## 2021-11-13 NOTE — ED Notes (Signed)
Patient gave registration labels and told them they were leaving ?

## 2021-11-14 ENCOUNTER — Encounter (HOSPITAL_BASED_OUTPATIENT_CLINIC_OR_DEPARTMENT_OTHER): Payer: Self-pay | Admitting: Obstetrics and Gynecology

## 2021-11-14 ENCOUNTER — Emergency Department (HOSPITAL_BASED_OUTPATIENT_CLINIC_OR_DEPARTMENT_OTHER)
Admission: EM | Admit: 2021-11-14 | Discharge: 2021-11-14 | Disposition: A | Discharge status: Home / Self Care | Payer: PRIVATE HEALTH INSURANCE | Attending: Emergency Medicine | Admitting: Emergency Medicine

## 2021-11-14 DIAGNOSIS — E876 Hypokalemia: Secondary | ICD-10-CM | POA: Diagnosis not present

## 2021-11-14 DIAGNOSIS — R531 Weakness: Secondary | ICD-10-CM | POA: Diagnosis present

## 2021-11-14 LAB — CBC
HCT: 37.3 % (ref 36.0–46.0)
Hemoglobin: 12.6 g/dL (ref 12.0–15.0)
MCH: 30.1 pg (ref 26.0–34.0)
MCHC: 33.8 g/dL (ref 30.0–36.0)
MCV: 89.2 fL (ref 80.0–100.0)
Platelets: 283 10*3/uL (ref 150–400)
RBC: 4.18 MIL/uL (ref 3.87–5.11)
RDW: 11.9 % (ref 11.5–15.5)
WBC: 5 10*3/uL (ref 4.0–10.5)
nRBC: 0 % (ref 0.0–0.2)

## 2021-11-14 LAB — HEPATIC FUNCTION PANEL
ALT: 27 U/L (ref 0–44)
AST: 21 U/L (ref 15–41)
Albumin: 4.5 g/dL (ref 3.5–5.0)
Alkaline Phosphatase: 45 U/L (ref 38–126)
Bilirubin, Direct: 0.1 mg/dL (ref 0.0–0.2)
Indirect Bilirubin: 0.2 mg/dL — ABNORMAL LOW (ref 0.3–0.9)
Total Bilirubin: 0.3 mg/dL (ref 0.3–1.2)
Total Protein: 7.3 g/dL (ref 6.5–8.1)

## 2021-11-14 LAB — BASIC METABOLIC PANEL
Anion gap: 14 (ref 5–15)
BUN: 8 mg/dL (ref 6–20)
CO2: 24 mmol/L (ref 22–32)
Calcium: 9.6 mg/dL (ref 8.9–10.3)
Chloride: 103 mmol/L (ref 98–111)
Creatinine, Ser: 0.97 mg/dL (ref 0.44–1.00)
GFR, Estimated: >60 mL/min (ref >60)
Glucose, Bld: 116 mg/dL — ABNORMAL HIGH (ref 70–99)
Potassium: 2.8 mmol/L — ABNORMAL LOW (ref 3.5–5.1)
Sodium: 141 mmol/L (ref 135–145)

## 2021-11-14 LAB — PREGNANCY, URINE: Preg Test, Ur: NEGATIVE

## 2021-11-14 LAB — TROPONIN I (HIGH SENSITIVITY)
Troponin I (High Sensitivity): 3 ng/L (ref <18)
Troponin I (High Sensitivity): 3 ng/L (ref <18)

## 2021-11-14 LAB — MAGNESIUM: Magnesium: 2 mg/dL (ref 1.7–2.4)

## 2021-11-14 MED ORDER — POTASSIUM CHLORIDE CRYS ER 20 MEQ PO TBCR: 20.0000 meq | EXTENDED_RELEASE_TABLET | Freq: Two times a day (BID) | ORAL | 0 refills | Status: DC

## 2021-11-14 MED ORDER — SODIUM CHLORIDE 0.9 % IV BOLUS
1000.0000 mL | Freq: Once | INTRAVENOUS | Status: AC
  Administered 2021-11-14: 1000 mL via INTRAVENOUS

## 2021-11-14 MED ORDER — POTASSIUM CHLORIDE 20 MEQ PO PACK
40.0000 meq | PACK | Freq: Two times a day (BID) | ORAL | Status: DC
  Administered 2021-11-14: 40 meq via ORAL
  Filled 2021-11-14: qty 2

## 2021-11-14 NOTE — Discharge Instructions (Signed)
Please drink Gatorade mixed half-and-half with water for oral repletion of fluid Take potassium as prescribed Call your doctor tomorrow for recheck of your potassium on Wednesday or Thursday Return if you are feeling worse

## 2021-11-14 NOTE — ED Triage Notes (Signed)
Patient reports to the ER for abnormal labs and palpitations. Patient reports she was told she had a K+ of 3 and feels as if her heart is pounding. Patient reports she went to [facility] yesterday and left due to the wait time

## 2021-11-14 NOTE — ED Provider Notes (Signed)
[facility] EMERGENCY DEPT Provider Note   CSN: [id number] Arrival date & time: 11/14/21  0745     History  Chief Complaint  Patient presents with   Abnormal Lab    [name] is a 36 y.o. female.  HPI 36 year old female with recent diagnosis of hypokalemia presents today complaining of generalized weakness, palpitations, and decreasing potassium.  She states that she was seen here several days ago and told her potassium was somewhat low.  She has been having diarrhea since last Thursday.  She reports a stooling 3-4 times a day with large and watery.  She has been orally repleting with water and electrolyte.  She states her last potassium here was 3.4.  She had it rechecked at an urgent care last night and was told that it was earlier.  She has tried some banana      Home Medications Prior to Admission medications   Medication Sig Start Date End Date Taking? Authorizing Provider  amLODipine (NORVASC) 5 MG tablet Take 1 tablet (5 mg total) by mouth daily. 05/14/21 06/13/21  Myna Hidalgo, DO  ARIPiprazole (ABILIFY) 20 MG tablet Take 20 mg by mouth at bedtime.     [provider]  furosemide (LASIX) 20 MG tablet Take 1 tablet (20 mg total) by mouth 2 (two) times daily for 5 days. 05/13/21 05/18/21  Myna Hidalgo, DO  hydrocortisone (ANUSOL-HC) 2.5 % rectal cream Place 1 application rectally 4 (four) times daily as needed for hemorrhoids or anal itching.    [provider]  lamoTRIgine (LAMICTAL) 25 MG tablet Take 50 mg by mouth daily.    [provider]  Melatonin 5 MG TABS Take 5 mg by mouth at bedtime.    [provider]  Multiple Vitamin (MULTIVITAMIN WITH MINERALS) TABS tablet Take 1 tablet by mouth daily.    [provider]  potassium chloride SA (KLOR-CON M) 20 MEQ tablet Take 1 tablet (20 mEq total) by mouth 2 (two) times daily. 11/14/21  Yes Margarita Grizzle, MD      Allergies    Dust mite extract, Pollen extract,  and Cat hair extract    Review of Systems   Review of Systems  Physical Exam Updated Vital Signs BP (!) 148/77   Pulse 84   Temp 98.2 F (36.8 C) (Oral)   Resp 18   Ht 1.626 m (5\' 4" )   Wt 77.1 kg   LMP 11/02/2021 (Approximate)   SpO2 100%   Breastfeeding No   BMI 29.18 kg/m  Physical Exam Vitals and nursing note reviewed.  Constitutional:      General: She is not in acute distress.    Appearance: She is well-developed.  HENT:     Head: Normocephalic and atraumatic.     Right Ear: External ear normal.     Left Ear: External ear normal.     Nose: Nose normal.  Eyes:     Conjunctiva/sclera: Conjunctivae normal.     Pupils: Pupils are equal, round, and reactive to light.  Pulmonary:     Effort: Pulmonary effort is normal.  Musculoskeletal:        General: Normal range of motion.     Cervical back: Normal range of motion and neck supple.  Skin:    General: Skin is warm and dry.  Neurological:     Mental Status: She is alert and oriented to person, place, and time.     Motor: No abnormal muscle tone.     Coordination: Coordination  normal.  Psychiatric:        Behavior: Behavior normal.        Thought Content: Thought content normal.     ED Results / Procedures / Treatments   Labs (all labs ordered are listed, but only abnormal results are displayed) Labs Reviewed  BASIC METABOLIC PANEL - Abnormal; Notable for the following components:      Result Value   Potassium 2.8 (*)    Glucose, Bld 116 (*)    All other components within normal limits  CBC  PREGNANCY, URINE  HEPATIC FUNCTION PANEL  MAGNESIUM  TROPONIN I (HIGH SENSITIVITY)  TROPONIN I (HIGH SENSITIVITY)    EKG EKG Interpretation  Date/Time:  Tuesday November 14 2021 08:14:03 EDT Ventricular Rate:  85 PR Interval:  159 QRS Duration: 90 QT Interval:  336 QTC Calculation: 400 R Axis:   84 Text Interpretation: Sinus arrhythmia RSR' in V1 or V2, right VCD or RVH Nonspecific T abnormalities, lateral  leads Baseline wander in lead(s) V5 Confirmed by Margarita Grizzle [phone]) on 11/14/2021 8:23:43 AM  Radiology No results found.  Procedures Procedures    Medications Ordered in ED Medications  potassium chloride (KLOR-CON) packet 40 mEq (40 mEq Oral Given 11/14/21 0938)  sodium chloride 0.9 % bolus 1,000 mL ( Intravenous Stopped 11/14/21 0933)    ED Course/ Medical Decision Making/ A&P Clinical Course as of 11/14/21 1138  Tue Nov 14, 2021  1048 [place] reviewed and interpreted hypokalemia noted with potassium of 2.8 [DR]  1048 CBC CBC reviewed interpreted within normal limits [DR]  1048 Pregnancy test is negative [DR]  1048 Troponin I (High Sensitivity) Troponin reviewed and normal at 3 [DR]    Clinical Course User Index [DR] Margarita Grizzle, MD                           Medical Decision Making 35 year old female with generalized weakness and known hypokalemia presents today for recheck of her potassium.  Here her potassium is low at 2.8.  Patient given 40 mEq of potassium here in the ED. Magnesium is pending Patient has had some diarrhea.  She is on Lamictal and Abilify.  Abilify has had some association with hypokalemia I do not find any reports of association with Lamictal. 11:38 AM Magnesium normal at 2  Amount and/or Complexity of Data Reviewed External Data Reviewed: labs and notes. Labs: ordered. Decision-making details documented in ED Course.  Risk Prescription drug management.          Final Clinical Impression(s) / ED Diagnoses Final diagnoses:  Hypokalemia    Rx / DC Orders ED Discharge Orders          Ordered    potassium chloride SA (KLOR-CON M) 20 MEQ tablet  2 times daily        11/14/21 1055              Margarita Grizzle, MD 11/14/21 1138

## 2021-11-15 ENCOUNTER — Other Ambulatory Visit: Payer: Self-pay | Admitting: Physician Assistant

## 2022-06-13 ENCOUNTER — Encounter (HOSPITAL_BASED_OUTPATIENT_CLINIC_OR_DEPARTMENT_OTHER): Payer: Self-pay | Admitting: Orthopedic Surgery

## 2022-06-13 ENCOUNTER — Other Ambulatory Visit: Payer: Self-pay

## 2022-06-13 ENCOUNTER — Other Ambulatory Visit: Payer: Self-pay | Admitting: Orthopedic Surgery

## 2022-06-14 ENCOUNTER — Ambulatory Visit (HOSPITAL_BASED_OUTPATIENT_CLINIC_OR_DEPARTMENT_OTHER): Payer: PRIVATE HEALTH INSURANCE | Admitting: Anesthesiology

## 2022-06-14 ENCOUNTER — Encounter (HOSPITAL_BASED_OUTPATIENT_CLINIC_OR_DEPARTMENT_OTHER): Payer: Self-pay | Admitting: Orthopedic Surgery

## 2022-06-14 ENCOUNTER — Encounter (HOSPITAL_BASED_OUTPATIENT_CLINIC_OR_DEPARTMENT_OTHER)
Admission: RE | Disposition: A | Discharge status: Home / Self Care | Payer: Self-pay | Source: Home / Self Care | Attending: Orthopedic Surgery

## 2022-06-14 ENCOUNTER — Other Ambulatory Visit: Payer: Self-pay

## 2022-06-14 ENCOUNTER — Ambulatory Visit (HOSPITAL_BASED_OUTPATIENT_CLINIC_OR_DEPARTMENT_OTHER)
Admission: RE | Admit: 2022-06-14 | Discharge: 2022-06-14 | Disposition: A | Discharge status: Home / Self Care | Payer: PRIVATE HEALTH INSURANCE | Attending: Orthopedic Surgery | Admitting: Orthopedic Surgery

## 2022-06-14 DIAGNOSIS — F419 Anxiety disorder, unspecified: Secondary | ICD-10-CM | POA: Insufficient documentation

## 2022-06-14 DIAGNOSIS — Z01818 Encounter for other preprocedural examination: Secondary | ICD-10-CM

## 2022-06-14 DIAGNOSIS — Z79899 Other long term (current) drug therapy: Secondary | ICD-10-CM | POA: Diagnosis not present

## 2022-06-14 DIAGNOSIS — M654 Radial styloid tenosynovitis [de Quervain]: Secondary | ICD-10-CM | POA: Insufficient documentation

## 2022-06-14 DIAGNOSIS — E7800 Pure hypercholesterolemia, unspecified: Secondary | ICD-10-CM | POA: Diagnosis not present

## 2022-06-14 DIAGNOSIS — F319 Bipolar disorder, unspecified: Secondary | ICD-10-CM | POA: Insufficient documentation

## 2022-06-14 DIAGNOSIS — Z86718 Personal history of other venous thrombosis and embolism: Secondary | ICD-10-CM | POA: Diagnosis not present

## 2022-06-14 DIAGNOSIS — I10 Essential (primary) hypertension: Secondary | ICD-10-CM | POA: Insufficient documentation

## 2022-06-14 HISTORY — PX: FASCIECTOMY: SHX6525

## 2022-06-14 HISTORY — DX: Unspecified pre-eclampsia, complicating the puerperium: O14.95

## 2022-06-14 LAB — POCT PREGNANCY, URINE: Preg Test, Ur: NEGATIVE

## 2022-06-14 SURGERY — FASCIECTOMY, PALM
Anesthesia: General | Site: Wrist | Laterality: Right

## 2022-06-14 MED ORDER — ONDANSETRON HCL 4 MG/2ML IJ SOLN
INTRAMUSCULAR | Status: AC
  Filled 2022-06-14: qty 2

## 2022-06-14 MED ORDER — ACETAMINOPHEN 500 MG PO TABS
1000.0000 mg | ORAL_TABLET | Freq: Once | ORAL | Status: AC
  Administered 2022-06-14: 1000 mg via ORAL

## 2022-06-14 MED ORDER — DEXAMETHASONE SODIUM PHOSPHATE 10 MG/ML IJ SOLN
INTRAMUSCULAR | Status: DC | PRN
  Administered 2022-06-14: 5 mg via INTRAVENOUS

## 2022-06-14 MED ORDER — DEXAMETHASONE SODIUM PHOSPHATE 10 MG/ML IJ SOLN
INTRAMUSCULAR | Status: AC
  Filled 2022-06-14: qty 1

## 2022-06-14 MED ORDER — BUPIVACAINE HCL (PF) 0.25 % IJ SOLN
INTRAMUSCULAR | Status: AC
  Filled 2022-06-14: qty 60

## 2022-06-14 MED ORDER — MIDAZOLAM HCL 5 MG/5ML IJ SOLN
INTRAMUSCULAR | Status: DC | PRN
  Administered 2022-06-14: 2 mg via INTRAVENOUS

## 2022-06-14 MED ORDER — MIDAZOLAM HCL 2 MG/2ML IJ SOLN
INTRAMUSCULAR | Status: AC
  Filled 2022-06-14: qty 2

## 2022-06-14 MED ORDER — LIDOCAINE HCL (CARDIAC) PF 100 MG/5ML IV SOSY
PREFILLED_SYRINGE | INTRAVENOUS | Status: DC | PRN
  Administered 2022-06-14: 80 mg via INTRAVENOUS

## 2022-06-14 MED ORDER — LACTATED RINGERS IV SOLN: INTRAVENOUS | Status: DC

## 2022-06-14 MED ORDER — FENTANYL CITRATE (PF) 100 MCG/2ML IJ SOLN
INTRAMUSCULAR | Status: DC | PRN
  Administered 2022-06-14: 50 ug via INTRAVENOUS

## 2022-06-14 MED ORDER — PROPOFOL 10 MG/ML IV BOLUS
INTRAVENOUS | Status: DC | PRN
  Administered 2022-06-14: 50 mg via INTRAVENOUS
  Administered 2022-06-14: 200 mg via INTRAVENOUS

## 2022-06-14 MED ORDER — 0.9 % SODIUM CHLORIDE (POUR BTL) OPTIME
TOPICAL | Status: DC | PRN
  Administered 2022-06-14: 75 mL

## 2022-06-14 MED ORDER — LIDOCAINE 2% (20 MG/ML) 5 ML SYRINGE
INTRAMUSCULAR | Status: AC
  Filled 2022-06-14: qty 5

## 2022-06-14 MED ORDER — SCOPOLAMINE 1 MG/3DAYS TD PT72
MEDICATED_PATCH | TRANSDERMAL | Status: AC
  Filled 2022-06-14: qty 1

## 2022-06-14 MED ORDER — BUPIVACAINE HCL (PF) 0.25 % IJ SOLN
INTRAMUSCULAR | Status: DC | PRN
  Administered 2022-06-14: 9 mL

## 2022-06-14 MED ORDER — CEFAZOLIN SODIUM-DEXTROSE 2-4 GM/100ML-% IV SOLN
INTRAVENOUS | Status: AC
  Filled 2022-06-14: qty 100

## 2022-06-14 MED ORDER — FENTANYL CITRATE (PF) 100 MCG/2ML IJ SOLN
INTRAMUSCULAR | Status: AC
  Filled 2022-06-14: qty 2

## 2022-06-14 MED ORDER — CEFAZOLIN SODIUM-DEXTROSE 2-4 GM/100ML-% IV SOLN
2.0000 g | INTRAVENOUS | Status: AC
  Administered 2022-06-14: 2 g via INTRAVENOUS

## 2022-06-14 MED ORDER — SCOPOLAMINE 1 MG/3DAYS TD PT72
1.0000 | MEDICATED_PATCH | TRANSDERMAL | Status: DC
  Administered 2022-06-14: 1.5 mg via TRANSDERMAL

## 2022-06-14 MED ORDER — ONDANSETRON HCL 4 MG/2ML IJ SOLN
INTRAMUSCULAR | Status: DC | PRN
  Administered 2022-06-14: 4 mg via INTRAVENOUS

## 2022-06-14 MED ORDER — ACETAMINOPHEN 500 MG PO TABS
ORAL_TABLET | ORAL | Status: AC
  Filled 2022-06-14: qty 2

## 2022-06-14 MED ORDER — TRAMADOL HCL 50 MG PO TABS: ORAL_TABLET | ORAL | 0 refills | Status: AC

## 2022-06-14 SURGICAL SUPPLY — 52 items
APL PRP STRL LF DISP 70% ISPRP (MISCELLANEOUS) ×1
BLADE MINI RND TIP GREEN BEAV (BLADE) IMPLANT
BLADE SURG 15 STRL LF DISP TIS (BLADE) ×2 IMPLANT
BLADE SURG 15 STRL SS (BLADE) ×2
BNDG CMPR 9X4 STRL LF SNTH (GAUZE/BANDAGES/DRESSINGS) ×1
BNDG ELASTIC 3X5.8 VLCR STR LF (GAUZE/BANDAGES/DRESSINGS) ×1 IMPLANT
BNDG ESMARK 4X9 LF (GAUZE/BANDAGES/DRESSINGS) ×1 IMPLANT
BNDG GAUZE DERMACEA FLUFF 4 (GAUZE/BANDAGES/DRESSINGS) ×1 IMPLANT
BNDG GZE DERMACEA 4 6PLY (GAUZE/BANDAGES/DRESSINGS) ×1
CHLORAPREP W/TINT 26 (MISCELLANEOUS) ×1 IMPLANT
CORD BIPOLAR FORCEPS 12FT (ELECTRODE) ×1 IMPLANT
COVER BACK TABLE 60X90IN (DRAPES) ×1 IMPLANT
COVER MAYO STAND STRL (DRAPES) ×1 IMPLANT
CUFF TOURN SGL QUICK 18X4 (TOURNIQUET CUFF) IMPLANT
DRAPE EXTREMITY T 121X128X90 (DISPOSABLE) ×1 IMPLANT
DRAPE SURG 17X23 STRL (DRAPES) ×1 IMPLANT
GAUZE PAD ABD 8X10 STRL (GAUZE/BANDAGES/DRESSINGS) IMPLANT
GAUZE SPONGE 4X4 12PLY STRL (GAUZE/BANDAGES/DRESSINGS) ×1 IMPLANT
GAUZE XEROFORM 1X8 LF (GAUZE/BANDAGES/DRESSINGS) ×1 IMPLANT
GLOVE BIO SURGEON STRL SZ7.5 (GLOVE) ×1 IMPLANT
GLOVE BIOGEL PI IND STRL 7.0 (GLOVE) IMPLANT
GLOVE BIOGEL PI IND STRL 8 (GLOVE) ×1 IMPLANT
GLOVE BIOGEL PI IND STRL 8.5 (GLOVE) ×1 IMPLANT
GLOVE SURG ORTHO 8.0 STRL STRW (GLOVE) IMPLANT
GLOVE SURG SYN 7.5  E (GLOVE) ×1
GLOVE SURG SYN 7.5 E (GLOVE) ×1 IMPLANT
GLOVE SURG SYN 7.5 PF PI (GLOVE) IMPLANT
GOWN STRL REUS W/ TWL LRG LVL3 (GOWN DISPOSABLE) ×1 IMPLANT
GOWN STRL REUS W/TWL LRG LVL3 (GOWN DISPOSABLE)
GOWN STRL REUS W/TWL XL LVL3 (GOWN DISPOSABLE) ×2 IMPLANT
LOOP VASCLR MAXI BLUE 18IN ST (MISCELLANEOUS) ×1 IMPLANT
LOOP VASCULAR MAXI 18 BLUE (MISCELLANEOUS) ×1
LOOPS VASCLR MAXI BLUE 18IN ST (MISCELLANEOUS) ×1 IMPLANT
NDL HYPO 25X1 1.5 SAFETY (NEEDLE) ×1 IMPLANT
NEEDLE HYPO 25X1 1.5 SAFETY (NEEDLE) ×1 IMPLANT
NS IRRIG 1000ML POUR BTL (IV SOLUTION) ×1 IMPLANT
PACK BASIN DAY SURGERY FS (CUSTOM PROCEDURE TRAY) ×1 IMPLANT
PAD CAST 3X4 CTTN HI CHSV (CAST SUPPLIES) ×1 IMPLANT
PADDING CAST ABS COTTON 3X4 (CAST SUPPLIES) IMPLANT
PADDING CAST ABS COTTON 4X4 ST (CAST SUPPLIES) ×1 IMPLANT
PADDING CAST COTTON 3X4 STRL (CAST SUPPLIES) ×1
SLEEVE SCD COMPRESS KNEE MED (STOCKING) ×1 IMPLANT
SPIKE FLUID TRANSFER (MISCELLANEOUS) IMPLANT
SPLINT PLASTER CAST XFAST 3X15 (CAST SUPPLIES) IMPLANT
STOCKINETTE 4X48 STRL (DRAPES) ×1 IMPLANT
SUT ETHILON 4 0 PS 2 18 (SUTURE) ×1 IMPLANT
SUT SILK 4 0 PS 2 (SUTURE) IMPLANT
SYR BULB EAR ULCER 3OZ GRN STR (SYRINGE) ×1 IMPLANT
SYR CONTROL 10ML LL (SYRINGE) ×1 IMPLANT
TOWEL GREEN STERILE FF (TOWEL DISPOSABLE) ×2 IMPLANT
UNDERPAD 30X36 HEAVY ABSORB (UNDERPADS AND DIAPERS) ×1 IMPLANT
VASCULAR TIE MAXI BLUE 18IN ST (MISCELLANEOUS) ×1

## 2022-06-14 NOTE — Anesthesia Postprocedure Evaluation (Signed)
Anesthesia Post Note  Patient: [name]  Procedure(s) Performed: RELEASE RIGHT FIRST DORSAL COMPARTMENT (Right: Wrist)     Patient location during evaluation: PACU Anesthesia Type: General Level of consciousness: awake and alert Pain management: pain level controlled Vital Signs Assessment: post-procedure vital signs reviewed and stable Respiratory status: spontaneous breathing, nonlabored ventilation, respiratory function stable and patient connected to nasal cannula oxygen Cardiovascular status: blood pressure returned to baseline and stable Postop Assessment: no apparent nausea or vomiting Anesthetic complications: no  No notable events documented.  Last Vitals:  Vitals:   06/14/22 1130 06/14/22 1145  BP: 110/63 109/70  Pulse: (!) 51 (!) 54  Resp: 17 13  Temp:    SpO2: 100% 100%    Last Pain:  Vitals:   06/14/22 1201  TempSrc:   PainSc: 0-No pain                 Tiajuana Amass

## 2022-06-14 NOTE — Anesthesia Preprocedure Evaluation (Addendum)
Anesthesia Evaluation  Patient identified by MRN, date of birth, ID band Patient awake    Reviewed: Allergy & Precautions, NPO status , Patient's Chart, lab work & pertinent test results  Airway Mallampati: II  TM Distance: >3 FB Neck ROM: Full    Dental  (+) Teeth Intact, Dental Advisory Given   Pulmonary    breath sounds clear to auscultation       Cardiovascular hypertension,  Rhythm:Regular Rate:Normal     Neuro/Psych  PSYCHIATRIC DISORDERS Anxiety  Bipolar Disorder      GI/Hepatic negative GI ROS, Neg liver ROS,,,  Endo/Other  negative endocrine ROS    Renal/GU negative Renal ROS     Musculoskeletal negative musculoskeletal ROS (+)    Abdominal   Peds  Hematology negative hematology ROS (+)   Anesthesia Other Findings   Reproductive/Obstetrics                             Anesthesia Physical Anesthesia Plan  ASA: 2  Anesthesia Plan: General   Post-op Pain Management: Tylenol PO (pre-op)*   Induction: Intravenous  PONV Risk Score and Plan: 4 or greater and Ondansetron, Dexamethasone, Midazolam and Scopolamine patch - Pre-op  Airway Management Planned: LMA  Additional Equipment: None  Intra-op Plan:   Post-operative Plan: Extubation in OR  Informed Consent: I have reviewed the patients History and Physical, chart, labs and discussed the procedure including the risks, benefits and alternatives for the proposed anesthesia with the patient or authorized representative who has indicated his/her understanding and acceptance.     Dental advisory given  Plan Discussed with: CRNA  Anesthesia Plan Comments:        Anesthesia Quick Evaluation

## 2022-06-14 NOTE — Transfer of Care (Signed)
Immediate Anesthesia Transfer of Care Note  Patient: [name]  Procedure(s) Performed: RELEASE RIGHT FIRST DORSAL COMPARTMENT (Right: Wrist)  Patient Location: PACU  Anesthesia Type:General  Level of Consciousness: sedated  Airway & Oxygen Therapy: Patient Spontanous Breathing and Patient connected to face mask oxygen  Post-op Assessment: Report given to RN and Post -op Vital signs reviewed and stable  Post vital signs: Reviewed and stable  Last Vitals:  Vitals Value Taken Time  BP 104/55 06/14/22 1110  Temp    Pulse    Resp 12 06/14/22 1111  SpO2    Vitals shown include unvalidated device data.  Last Pain:  Vitals:   06/14/22 0859  TempSrc: Oral  PainSc: 4       Patients Stated Pain Goal: 4 (55/20/80 2233)  Complications: No notable events documented.

## 2022-06-14 NOTE — Op Note (Signed)
NAME: [name] MEDICAL RECORD NO: [id number] DATE OF BIRTH: 1985-10-10 FACILITY[facility] LOCATION: [facility] PHYSICIAN: Tennis Must, MD   OPERATIVE REPORT   DATE OF PROCEDURE: 06/14/22    PREOPERATIVE DIAGNOSIS: Right de Quervain's tenosynovitis   POSTOPERATIVE DIAGNOSIS: Right de Quervain's tenosynovitis   PROCEDURE: Right first dorsal compartment release   SURGEON:  Leanora Cover, M.D.   ASSISTANT: none   ANESTHESIA:  General   INTRAVENOUS FLUIDS:  Per anesthesia flow sheet.   ESTIMATED BLOOD LOSS:  Minimal.   COMPLICATIONS:  None.   SPECIMENS:  none   TOURNIQUET TIME:    Total Tourniquet Time Documented: Upper Arm (Right) - 12 minutes Total: Upper Arm (Right) - 12 minutes    DISPOSITION:  Stable to PACU.   INDICATIONS: 36 year old female with right de Quervain's tenosynovitis.  She has tried splinting and injection without lasting relief.  She wishes to have surgical release of the right first dorsal compartment for management of symptoms.  Risks, benefits and alternatives of surgery were discussed including the risks of blood loss, infection, damage to nerves, vessels, tendons, ligaments, bone for surgery, need for additional surgery, complications with wound healing, continued pain, stiffness.  She voiced understanding of these risks and elected to proceed.  OPERATIVE COURSE:  After being identified preoperatively by myself,  the patient and I agreed on the procedure and site of the procedure.  The surgical site was marked.  Surgical consent had been signed. Preoperative IV antibiotic prophylaxis was given. She was transferred to the operating room and placed on the operating table in supine position with the Right upper extremity on an arm board.  General anesthesia was induced by the anesthesiologist.  Right upper extremity was prepped and draped in normal sterile orthopedic fashion.  A surgical pause was performed between the surgeons,  anesthesia, and operating room staff and all were in agreement as to the patient, procedure, and site of procedure.  Tourniquet at the proximal aspect of the extremity was inflated to 250 mmHg after exsanguination of the arm with an Esmarch bandage.  Incision was made and a chevron type pattern at the radial side of the wrist.  This was done to avoid the blanched skin from her previous steroid injections to try and ensure good wound healing.  The subcutaneous tissues were entered by spreading technique.  Branches of the superficial branch of radial nerve were protected throughout the case.  There was 1 branch noted going dorsal to the incision.  The first dorsal compartment was identified.  There was whitish material consistent with previous steroid injections on top which was removed.  The compartment was released with the scissors over the APL tendons.  There is a separate compartment for the EPB tendon.  This was also released.  The septation between the 2 was removed.  There is no residual compression.  The wound was copiously irrigated with sterile saline and closed with 4-0 nylon in a horizontal mattress fashion.  It was injected with quarter percent plain Marcaine to aid in postoperative analgesia.  It was then dressed with sterile Xeroform and 4 x 4's and an ABD used as a splint.  This was wrapped with Kerlix and Ace bandage.  The tourniquet was deflated at 12 minutes.  Fingertips were pink with brisk capillary refill after deflation of tourniquet.  The operative  drapes were broken down.  The patient was awoken from anesthesia safely.  She was transferred back to the stretcher and taken to  PACU in stable condition.  I will see her back in the office in 1 week for postoperative followup.  I will give her a prescription for Norco 5/325 1-2 tabs PO q6 hours prn pain, dispense # 15.   Leanora Cover, MD Electronically signed, 06/14/22

## 2022-06-14 NOTE — Discharge Instructions (Addendum)
May have Tylenol after 1 pm   Post Anesthesia Home Care Instructions  Activity: Get plenty of rest for the remainder of the day. A responsible individual must stay with you for 24 hours following the procedure.  For the next 24 hours, DO NOT: -Drive a car -Paediatric nurse -Drink alcoholic beverages -Take any medication unless instructed by your physician -Make any legal decisions or sign important papers.  Meals: Start with liquid foods such as gelatin or soup. Progress to regular foods as tolerated. Avoid greasy, spicy, heavy foods. If nausea and/or vomiting occur, drink only clear liquids until the nausea and/or vomiting subsides. Call your physician if vomiting continues.  Special Instructions/Symptoms: Your throat may feel dry or sore from the anesthesia or the breathing tube placed in your throat during surgery. If this causes discomfort, gargle with warm salt water. The discomfort should disappear within 24 hours.  If you had a scopolamine patch placed behind your ear for the management of post- operative nausea and/or vomiting:  1. The medication in the patch is effective for 72 hours, after which it should be removed.  Wrap patch in a tissue and discard in the trash. Wash hands thoroughly with soap and water. 2. You may remove the patch earlier than 72 hours if you experience unpleasant side effects which may include dry mouth, dizziness or visual disturbances. 3. Avoid touching the patch. Wash your hands with soap and water after contact with the patch.         [facility] Instructions Hand Surgery  Wound Care: Keep your hand elevated above the level of your heart.  Do not allow it to dangle by your side.  Keep the dressing dry and do not remove it unless your doctor advises you to do so.  He will usually change it at the time of your post-op visit.  Moving your fingers is advised to stimulate circulation but will depend on the site of your surgery.  If you have a splint  applied, your doctor will advise you regarding movement.  Activity: Do not drive or operate machinery today.  Rest today and then you may return to your normal activity and work as indicated by your physician.  Diet:  Drink liquids today or eat a light diet.  You may resume a regular diet tomorrow.    General expectations: Pain for two to three days. Fingers may become slightly swollen.  Call your doctor if any of the following occur: Severe pain not relieved by pain medication. Elevated temperature. Dressing soaked with blood. Inability to move fingers. White or bluish color to fingers.

## 2022-06-14 NOTE — H&P (Signed)
[name] is an 37 y.o. female.   Chief Complaint: dequervains tenosynovitis HPI: 37 yo female with right dequervains tenosynovitis.  Has been present ~1 year.  Has tried splinting and injection without lasting relief.  Last injection in October 2023.  She wishes to proceed with operative release of first dorsal compartment.  Allergies:  Allergies  Allergen Reactions   Dust Mite Extract Shortness Of Breath    Grass, Cats, weeds (Requires Epipen)   Pollen Extract Anaphylaxis and Hives   Cat Hair Extract     Sniffles     Past Medical History:  Diagnosis Date   Anxiety    Bipolar disorder ([place])    Deep vein blood clot of left lower extremity (HCC)    Family history of breast cancer    Genetic testing 05/03/2017   Common Cancers panel (47 genes) @ [facility] - No pathogenic mutations detected   Hypercholesterolemia    Myalgia    Pre-eclampsia in postpartum period    Seasonal allergies    Vitamin D deficiency     Past Surgical History:  Procedure Laterality Date   CESAREAN SECTION     COLONOSCOPY  02/02/2019   [facility]   SALPINGECTOMY Bilateral    TONSILLECTOMY AND ADENOIDECTOMY      Family History: Family History  Problem Relation Age of Onset   Cancer Mother    Ovarian cancer Mother        stage 27    Breast cancer Mother        double mastectomy in 2019   Prostate cancer Paternal Uncle        deceased 82s   Prostate cancer Paternal Uncle        deceased 57s   Colon cancer Paternal Uncle    Breast cancer Maternal Grandmother 38       2nd primary vs. recurrence in 45s   Breast cancer Paternal Grandmother    Colon cancer Paternal Grandfather    Breast cancer Cousin    Breast cancer Cousin    Breast cancer Other        mother's paternal first cousin; dx 16s   Breast cancer Other        paternal grandmother's sister; currently 43s   Esophageal cancer Neg Hx     Social History:   reports that she has never smoked. She has never been  exposed to tobacco smoke. She has never used smokeless tobacco. She reports current alcohol use of about 2.0 standard drinks of alcohol per week. She reports that she does not use drugs.  Medications: Medications Prior to Admission  Medication Sig Dispense Refill   ARIPiprazole (ABILIFY) 20 MG tablet Take 20 mg by mouth at bedtime.      lamoTRIgine (LAMICTAL) 25 MG tablet Take 50 mg by mouth daily.     Melatonin 5 MG TABS Take 5 mg by mouth at bedtime.      Results for orders placed or performed during the hospital encounter of 06/14/22 (from the past 48 hour(s))  Pregnancy, urine POC     Status: None   Collection Time: 06/14/22  8:51 AM  Result Value Ref Range   Preg Test, Ur NEGATIVE NEGATIVE    Comment:        THE SENSITIVITY OF THIS METHODOLOGY IS >24 mIU/mL     No results found.    Blood pressure 116/74, pulse 78, temperature 97.8 F (36.6 C), temperature source Oral, resp. rate 16, height 5\' 4"  (1.626 m), weight  79.6 kg, SpO2 100 %, not currently breastfeeding.  General appearance: alert, cooperative, and appears stated age Head: Normocephalic, without obvious abnormality, atraumatic Neck: supple, symmetrical, trachea midline Extremities: Intact sensation and capillary refill all digits.  +epl/fpl/io.  No wounds.  Pulses: 2+ and symmetric Skin: Skin color, texture, turgor normal. No rashes or lesions Neurologic: Grossly normal Incision/Wound: none  Assessment/Plan Right dequervains tenosynovitis.  Non operative and operative treatment options have been discussed with the patient and patient wishes to proceed with operative treatment. Risks, benefits and alternatives of surgery were discussed including risks of blood loss, infection, damage to nerves/vessels/tendons/ligament/bone, failure of surgery, need for additional surgery, complication with wound healing, stiffness.  She voiced understanding of these risks and elected to proceed.    Leanora Cover 06/14/2022, 10:21  AM

## 2022-06-14 NOTE — Anesthesia Procedure Notes (Signed)
Procedure Name: LMA Insertion Date/Time: 06/14/2022 10:38 AM  Performed by: Maryella Shivers, CRNAPre-anesthesia Checklist: Patient identified, Emergency Drugs available, Suction available and Patient being monitored Patient Re-evaluated:Patient Re-evaluated prior to induction Oxygen Delivery Method: Circle system utilized Preoxygenation: Pre-oxygenation with 100% oxygen Induction Type: IV induction Ventilation: Mask ventilation without difficulty LMA: LMA inserted LMA Size: 4.0 Number of attempts: 1 Airway Equipment and Method: Bite block Placement Confirmation: positive ETCO2 Tube secured with: Tape Dental Injury: Teeth and Oropharynx as per pre-operative assessment

## 2022-06-15 ENCOUNTER — Encounter (HOSPITAL_BASED_OUTPATIENT_CLINIC_OR_DEPARTMENT_OTHER): Payer: Self-pay | Admitting: Orthopedic Surgery

## 2023-08-15 ENCOUNTER — Emergency Department (HOSPITAL_COMMUNITY): Payer: PRIVATE HEALTH INSURANCE

## 2023-08-15 ENCOUNTER — Other Ambulatory Visit: Payer: Self-pay

## 2023-08-15 ENCOUNTER — Emergency Department (HOSPITAL_COMMUNITY)
Admission: EM | Admit: 2023-08-15 | Discharge: 2023-08-15 | Disposition: A | Discharge status: Home / Self Care | Payer: PRIVATE HEALTH INSURANCE | Attending: Emergency Medicine | Admitting: Emergency Medicine

## 2023-08-15 ENCOUNTER — Encounter (HOSPITAL_COMMUNITY): Payer: Self-pay

## 2023-08-15 DIAGNOSIS — R0602 Shortness of breath: Secondary | ICD-10-CM | POA: Insufficient documentation

## 2023-08-15 DIAGNOSIS — R079 Chest pain, unspecified: Secondary | ICD-10-CM | POA: Insufficient documentation

## 2023-08-15 DIAGNOSIS — R1010 Upper abdominal pain, unspecified: Secondary | ICD-10-CM | POA: Insufficient documentation

## 2023-08-15 DIAGNOSIS — G8918 Other acute postprocedural pain: Secondary | ICD-10-CM | POA: Insufficient documentation

## 2023-08-15 LAB — CBC WITH DIFFERENTIAL/PLATELET
Abs Immature Granulocytes: 0.01 10*3/uL (ref 0.00–0.07)
Basophils Absolute: 0 10*3/uL (ref 0.0–0.1)
Basophils Relative: 1 %
Eosinophils Absolute: 0.2 10*3/uL (ref 0.0–0.5)
Eosinophils Relative: 3 %
HCT: 34.1 % — ABNORMAL LOW (ref 36.0–46.0)
Hemoglobin: 11.3 g/dL — ABNORMAL LOW (ref 12.0–15.0)
Immature Granulocytes: 0 %
Lymphocytes Relative: 20 %
Lymphs Abs: 1.5 10*3/uL (ref 0.7–4.0)
MCH: 31.7 pg (ref 26.0–34.0)
MCHC: 33.1 g/dL (ref 30.0–36.0)
MCV: 95.8 fL (ref 80.0–100.0)
Monocytes Absolute: 0.4 10*3/uL (ref 0.1–1.0)
Monocytes Relative: 5 %
Neutro Abs: 5.3 10*3/uL (ref 1.7–7.7)
Neutrophils Relative %: 71 %
Platelets: 201 10*3/uL (ref 150–400)
RBC: 3.56 MIL/uL — ABNORMAL LOW (ref 3.87–5.11)
RDW: 12.2 % (ref 11.5–15.5)
WBC: 7.4 10*3/uL (ref 4.0–10.5)
nRBC: 0 % (ref 0.0–0.2)

## 2023-08-15 LAB — URINALYSIS, ROUTINE W REFLEX MICROSCOPIC
Bilirubin Urine: NEGATIVE
Glucose, UA: NEGATIVE mg/dL
Ketones, ur: NEGATIVE mg/dL
Nitrite: NEGATIVE
Protein, ur: NEGATIVE mg/dL
Specific Gravity, Urine: 1.009 (ref 1.005–1.030)
pH: 7 (ref 5.0–8.0)

## 2023-08-15 LAB — COMPREHENSIVE METABOLIC PANEL WITH GFR
ALT: 16 U/L (ref 0–44)
AST: 17 U/L (ref 15–41)
Albumin: 3.5 g/dL (ref 3.5–5.0)
Alkaline Phosphatase: 35 U/L — ABNORMAL LOW (ref 38–126)
Anion gap: 8 (ref 5–15)
BUN: 11 mg/dL (ref 6–20)
CO2: 26 mmol/L (ref 22–32)
Calcium: 8.5 mg/dL — ABNORMAL LOW (ref 8.9–10.3)
Chloride: 103 mmol/L (ref 98–111)
Creatinine, Ser: 0.87 mg/dL (ref 0.44–1.00)
GFR, Estimated: >60 mL/min (ref >60)
Glucose, Bld: 90 mg/dL (ref 70–99)
Potassium: 3.9 mmol/L (ref 3.5–5.1)
Sodium: 137 mmol/L (ref 135–145)
Total Bilirubin: 0.4 mg/dL (ref 0.0–1.2)
Total Protein: 6.3 g/dL — ABNORMAL LOW (ref 6.5–8.1)

## 2023-08-15 LAB — LIPASE, BLOOD: Lipase: 32 U/L (ref 11–51)

## 2023-08-15 MED ORDER — ONDANSETRON HCL 4 MG/2ML IJ SOLN
4.0000 mg | Freq: Once | INTRAMUSCULAR | Status: AC
  Administered 2023-08-15: 4 mg via INTRAVENOUS
  Filled 2023-08-15: qty 2

## 2023-08-15 MED ORDER — KETOROLAC TROMETHAMINE 15 MG/ML IJ SOLN
15.0000 mg | Freq: Once | INTRAMUSCULAR | Status: AC
  Administered 2023-08-15: 15 mg via INTRAVENOUS
  Filled 2023-08-15: qty 1

## 2023-08-15 MED ORDER — IOHEXOL 350 MG/ML SOLN
100.0000 mL | Freq: Once | INTRAVENOUS | Status: AC | PRN
  Administered 2023-08-15: 100 mL via INTRAVENOUS

## 2023-08-15 MED ORDER — MORPHINE SULFATE (PF) 4 MG/ML IV SOLN
4.0000 mg | Freq: Once | INTRAVENOUS | Status: AC
  Administered 2023-08-15: 4 mg via INTRAVENOUS
  Filled 2023-08-15: qty 1

## 2023-08-15 NOTE — ED Triage Notes (Signed)
 Pt arrived via POV c/o abdominal pain and difficulty taking in deep breaths following a partial hysterectomy surgery she had at [facility] on Monday this week. Pt reports she came to [facility] for evaluation due to not wanting to drive back to [place].

## 2023-08-15 NOTE — Discharge Instructions (Signed)
 Pleasure taking care of you today.  You were evaluated for chest and abdominal pain with shortness of breath after recent hysterectomy.  Your workup was overall reassuring.  There is no signs of infection.  Follow-up with your OB/GYN doctor at [facility].  Call tomorrow and let them know you need less follow-up.  He has a follow-up with your PCP, regarding the abdominal pain.  CT showed incidental finding of periportal edema, which is especially some mild inflammation in the area of your liver.  Follow-up with GI for continued having pain in the right upper quadrant.  This is very nonspecific and does not require any further emergent evaluation.

## 2023-08-15 NOTE — ED Provider Notes (Signed)
 [place] EMERGENCY DEPARTMENT AT [facility] Provider Note   CSN: [id number] Arrival date & time: 08/15/23  1102     History  Chief Complaint  Patient presents with   Post-op Problem    [name] is a 38 y.o. female.  She presents the ER complaining of upper abdominal pain and chest pain with difficulty breathing since coming home from her abdominal hysterectomy on 08/12/2023.  She is taking Tylenol, ibuprofen and oxycodone, states she still having a lot of pain, states it hurts to take a deep breath and she gets winded when she tries to walk, no lower extremity pain or swelling.  No fevers or chills.  No dizziness or syncope.  Denies constipation.  HPI     Home Medications Prior to Admission medications   Medication Sig Start Date End Date Taking? Authorizing Provider  oxyCODONE (OXY IR/ROXICODONE) 5 MG immediate release tablet Take by mouth. 08/14/23 08/17/23 Yes [provider]  ARIPiprazole (ABILIFY) 20 MG tablet Take 20 mg by mouth at bedtime.     [provider]  lamoTRIgine (LAMICTAL) 25 MG tablet Take 50 mg by mouth daily.    [provider]  Melatonin 5 MG TABS Take 5 mg by mouth at bedtime.    [provider]  traMADol Janean Sark) 50 MG tablet 1-2 tabs PO q6 hours prn pain 06/14/22   Betha Loa, MD      Allergies    Dust mite extract, Pollen extract, and Cat dander    Review of Systems   Review of Systems  Physical Exam Updated Vital Signs BP 132/81 (BP Location: Right Arm)   Pulse 71   Temp 98.9 F (37.2 C) (Oral)   Resp 16   Ht 5\' 4"  (1.626 m)   Wt 79.5 kg   LMP 11/02/2021 (Approximate)   SpO2 100%   BMI 30.08 kg/m  Physical Exam Vitals and nursing note reviewed.  Constitutional:      General: She is not in acute distress.    Appearance: She is well-developed.  HENT:     Head: Normocephalic and atraumatic.  Eyes:     Conjunctiva/sclera: Conjunctivae normal.  Cardiovascular:     Rate and Rhythm:  Normal rate and regular rhythm.     Heart sounds: No murmur heard. Pulmonary:     Effort: Pulmonary effort is normal. No respiratory distress.     Breath sounds: Normal breath sounds.  Abdominal:     Palpations: Abdomen is soft.     Tenderness: There is abdominal tenderness in the right upper quadrant and epigastric area. There is no right CVA tenderness, left CVA tenderness, guarding or rebound.  Musculoskeletal:        General: No swelling.     Cervical back: Neck supple.  Skin:    General: Skin is warm and dry.     Capillary Refill: Capillary refill takes less than 2 seconds.  Neurological:     General: No focal deficit present.     Mental Status: She is alert and oriented to person, place, and time.  Psychiatric:        Mood and Affect: Mood normal.     ED Results / Procedures / Treatments   Labs (all labs ordered are listed, but only abnormal results are displayed) Labs Reviewed  CBC WITH DIFFERENTIAL/PLATELET - Abnormal; Notable for the following components:      Result Value   RBC 3.56 (*)    Hemoglobin 11.3 (*)    HCT  34.1 (*)    All other components within normal limits  COMPREHENSIVE METABOLIC PANEL WITH GFR  LIPASE, BLOOD  URINALYSIS, ROUTINE W REFLEX MICROSCOPIC    EKG None  Radiology No results found.  Procedures Procedures    Medications Ordered in ED Medications  morphine (PF) 4 MG/ML injection 4 mg (has no administration in time range)  ondansetron (ZOFRAN) injection 4 mg (has no administration in time range)    ED Course/ Medical Decision Making/ A&P                                 Medical Decision Making Differential diagnosis includes but not limited to PE, pneumonia, postoperative infection, muscle strain, cholecystitis, cholelithiasis, pancreatitis, ureterolithiasis, UTI other  Course: Patient presents for evaluation of chest and upper abdominal pain after laparoscopic hysterectomy earlier this week at [facility].  She has been up and  walking around but states she is short of breath with walking and discomfort becomes worse with walking.  She has been taking prescribed pain medication but is still uncomfortable.  She states she was worried about a possible infection and wanted to be evaluated.  She was not able to make it all the way to [facility] so decided to come here for evaluation.    Labs: CBC and CMP are reassuring.  Urinalysis shows small leuks and 16 red blood cells but rare bacteria, no nitrate.  Patient's not having UTI symptoms and there are 16 squamous epithelial cells, do not feel represents UTI given lack of symptoms.  Lipase is normal.    Doing: CT angio chest ordered to rule out PE, no PE, no pneumonia, trace right pleural effusion and subsegmental atelectasis  CT abdomen pelvis-mild periportal edema which is nonspecific, no cholecystitis, no ureterolithiasis, pancreas is normal, small dependent fluid the pelvis likely hematoma not suggestive of abscess  Interpreted images independently and agree with radiology reading.  Discussed patient's presentation labs and imaging with Dr. Suezanne Jacquet, agreed with no further evaluation needed in the ED, patient to follow-up closely with her OB or GYN who did her surgery and come back to the ER for new or worsening symptoms.  Amount and/or Complexity of Data Reviewed External Data Reviewed: labs and notes. Labs: ordered. Decision-making details documented in ED Course. Radiology: ordered.  Risk Prescription drug management.           Final Clinical Impression(s) / ED Diagnoses Final diagnoses:  None    Rx / DC Orders ED Discharge Orders     None         Ma Rings, PA-C 08/15/23 1706    Lonell Grandchild, MD 08/16/23 1133

## 2023-09-30 ENCOUNTER — Other Ambulatory Visit: Payer: Self-pay

## 2023-09-30 ENCOUNTER — Encounter (HOSPITAL_BASED_OUTPATIENT_CLINIC_OR_DEPARTMENT_OTHER): Payer: Self-pay | Admitting: Emergency Medicine

## 2023-09-30 ENCOUNTER — Other Ambulatory Visit (HOSPITAL_BASED_OUTPATIENT_CLINIC_OR_DEPARTMENT_OTHER): Payer: Self-pay

## 2023-09-30 ENCOUNTER — Emergency Department (HOSPITAL_BASED_OUTPATIENT_CLINIC_OR_DEPARTMENT_OTHER)
Admission: EM | Admit: 2023-09-30 | Discharge: 2023-09-30 | Disposition: A | Discharge status: Home / Self Care | Payer: PRIVATE HEALTH INSURANCE

## 2023-09-30 DIAGNOSIS — R059 Cough, unspecified: Secondary | ICD-10-CM | POA: Diagnosis not present

## 2023-09-30 DIAGNOSIS — R112 Nausea with vomiting, unspecified: Secondary | ICD-10-CM | POA: Insufficient documentation

## 2023-09-30 DIAGNOSIS — R5383 Other fatigue: Secondary | ICD-10-CM | POA: Insufficient documentation

## 2023-09-30 DIAGNOSIS — R0981 Nasal congestion: Secondary | ICD-10-CM | POA: Diagnosis not present

## 2023-09-30 LAB — COMPREHENSIVE METABOLIC PANEL WITH GFR
ALT: 9 U/L (ref 0–44)
AST: 16 U/L (ref 15–41)
Albumin: 4.6 g/dL (ref 3.5–5.0)
Alkaline Phosphatase: 51 U/L (ref 38–126)
Anion gap: 12 (ref 5–15)
BUN: 14 mg/dL (ref 6–20)
CO2: 24 mmol/L (ref 22–32)
Calcium: 9.4 mg/dL (ref 8.9–10.3)
Chloride: 103 mmol/L (ref 98–111)
Creatinine, Ser: 1.02 mg/dL — ABNORMAL HIGH (ref 0.44–1.00)
GFR, Estimated: >60 mL/min (ref >60)
Glucose, Bld: 83 mg/dL (ref 70–99)
Potassium: 4.1 mmol/L (ref 3.5–5.1)
Sodium: 139 mmol/L (ref 135–145)
Total Bilirubin: 0.5 mg/dL (ref 0.0–1.2)
Total Protein: 7.9 g/dL (ref 6.5–8.1)

## 2023-09-30 LAB — URINALYSIS, ROUTINE W REFLEX MICROSCOPIC
Bilirubin Urine: NEGATIVE
Glucose, UA: NEGATIVE mg/dL
Hgb urine dipstick: NEGATIVE
Ketones, ur: NEGATIVE mg/dL
Nitrite: NEGATIVE
Protein, ur: NEGATIVE mg/dL
Specific Gravity, Urine: 1.011 (ref 1.005–1.030)
pH: 6 (ref 5.0–8.0)

## 2023-09-30 LAB — CBC WITH DIFFERENTIAL/PLATELET
Abs Immature Granulocytes: 0.03 10*3/uL (ref 0.00–0.07)
Basophils Absolute: 0 10*3/uL (ref 0.0–0.1)
Basophils Relative: 1 %
Eosinophils Absolute: 0.2 10*3/uL (ref 0.0–0.5)
Eosinophils Relative: 2 %
HCT: 39.8 % (ref 36.0–46.0)
Hemoglobin: 13.6 g/dL (ref 12.0–15.0)
Immature Granulocytes: 0 %
Lymphocytes Relative: 12 %
Lymphs Abs: 1 10*3/uL (ref 0.7–4.0)
MCH: 31.3 pg (ref 26.0–34.0)
MCHC: 34.2 g/dL (ref 30.0–36.0)
MCV: 91.5 fL (ref 80.0–100.0)
Monocytes Absolute: 0.4 10*3/uL (ref 0.1–1.0)
Monocytes Relative: 4 %
Neutro Abs: 6.8 10*3/uL (ref 1.7–7.7)
Neutrophils Relative %: 81 %
Platelets: 243 10*3/uL (ref 150–400)
RBC: 4.35 MIL/uL (ref 3.87–5.11)
RDW: 11.2 % — ABNORMAL LOW (ref 11.5–15.5)
WBC: 8.4 10*3/uL (ref 4.0–10.5)
nRBC: 0 % (ref 0.0–0.2)

## 2023-09-30 LAB — LIPASE, BLOOD: Lipase: 53 U/L — ABNORMAL HIGH (ref 11–51)

## 2023-09-30 MED ORDER — ONDANSETRON HCL 4 MG/2ML IJ SOLN
4.0000 mg | Freq: Once | INTRAMUSCULAR | Status: AC
  Administered 2023-09-30: 4 mg via INTRAVENOUS
  Filled 2023-09-30: qty 2

## 2023-09-30 MED ORDER — ONDANSETRON 4 MG PO TBDP
4.0000 mg | ORAL_TABLET | Freq: Three times a day (TID) | ORAL | 0 refills | Status: AC | PRN
  Filled 2023-09-30: qty 20, 7d supply, fill #0

## 2023-09-30 MED ORDER — LACTATED RINGERS IV BOLUS
1000.0000 mL | Freq: Once | INTRAVENOUS | Status: AC
  Administered 2023-09-30: 1000 mL via INTRAVENOUS

## 2023-09-30 NOTE — ED Provider Notes (Signed)
 [place] EMERGENCY DEPARTMENT AT [facility] Provider Note   CSN: [id number] Arrival date & time: 09/30/23  4098     History  Chief Complaint  Patient presents with   Emesis    [name] is a 38 y.o. female.  38 year old female with past medical history of anxiety with hysterectomy back in March presenting to the emergency department today with nausea and vomiting.  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  She states that she started yesterday evening with nausea and vomiting.  This been nonbloody and nonbilious.  She denies any significant abdominal pain with this.  She states that she has been having normal bowel movements thus far.  Denies any fevers.  She has had some nasal congestion and mild cough but this is well.  She came to the ER today primarily because she was vomiting again was feeling some fatigue and lightheadedness secondary to this.   Emesis      Home Medications Prior to Admission medications   Medication Sig Start Date End Date Taking? Authorizing Provider  cariprazine (VRAYLAR) 3 MG capsule Take 3 mg by mouth daily. 09/27/23  Yes [provider]  ondansetron  (ZOFRAN -ODT) 4 MG disintegrating tablet Take 1 tablet (4 mg total) by mouth every 8 (eight) hours as needed for nausea or vomiting. 09/30/23  Yes Carin Charleston, MD  ZEPBOUND 5 MG/0.5ML Pen Inject 5 mg into the skin once a week. 09/12/23  Yes [provider]  ARIPiprazole  (ABILIFY ) 20 MG tablet Take 20 mg by mouth at bedtime.     [provider]  lamoTRIgine  (LAMICTAL ) 25 MG tablet Take 50 mg by mouth daily.    [provider]  Melatonin 5 MG TABS Take 5 mg by mouth at bedtime.    [provider]  traMADol  (ULTRAM ) 50 MG tablet 1-2 tabs PO q6 hours prn pain 06/14/22   Brunilda Capra, MD      Allergies    Dust mite extract, Pollen extract, and Cat dander    Review of Systems   Review of  Systems  HENT:  Positive for congestion.   Gastrointestinal:  Positive for nausea and vomiting.  All other systems reviewed and are negative.   Physical Exam Updated Vital Signs BP 116/75   Pulse 73   Temp 98.6 F (37 C) (Oral)   Resp 16   LMP 11/02/2021 (Approximate)   SpO2 100%  Physical Exam Vitals and nursing note reviewed.   Gen: NAD Eyes: PERRL, EOMI HEENT: no oropharyngeal swelling Neck: trachea midline Resp: clear to auscultation bilaterally Card: RRR, no murmurs, rubs, or gallops Abd: nontender, nondistended Extremities: no calf tenderness, no edema Vascular: 2+ radial pulses bilaterally, 2+ DP pulses bilaterally Skin: no rashes Psyc: acting appropriately   ED Results / Procedures / Treatments   Labs (all labs ordered are listed, but only abnormal results are displayed) Labs Reviewed  CBC WITH DIFFERENTIAL/PLATELET - Abnormal; Notable for the following components:      Result Value   RDW 11.2 (*)    All other components within normal limits  COMPREHENSIVE METABOLIC PANEL WITH GFR - Abnormal; Notable for the following components:   Creatinine, Ser 1.02 (*)    All other components within normal limits  LIPASE, BLOOD - Abnormal; Notable for the following components:   Lipase 53 (*)    All other components within normal limits  URINALYSIS, ROUTINE W REFLEX MICROSCOPIC - Abnormal; Notable for  the following components:   APPearance HAZY (*)    Leukocytes,Ua MODERATE (*)    Bacteria, UA RARE (*)    All other components within normal limits    EKG None  Radiology No results found.  Procedures Procedures    Medications Ordered in ED Medications  lactated ringers  bolus 1,000 mL ( Intravenous Stopped 09/30/23 0851)  ondansetron  (ZOFRAN ) injection 4 mg (4 mg Intravenous Given 09/30/23 0745)    ED Course/ Medical Decision Making/ A&P Clinical Course as of 09/30/23 1044  Mon Sep 30, 2023  0914 . [AF]    Clinical Course User Index [AF] Rexie Catena, PA-C                                 Medical Decision Making 38 year old female presenting to the emergency department today with nausea and vomiting as well as some nasal congestion and URI symptoms.  I will further evaluate patient here with basic labs to evaluate for anemia or electrolyte abnormalities.  Will give the patient IV fluids and Zofran .  History and exam is most consistent with likely gastroenteritis.  Her abdominal exam is reassuring.  Suspicion for appendicitis, diverticulitis, colitis, obstruction, or other serious intra-abdominal pathology is relatively low at this time.  If the patient does not improve with symptomatic treatment here or has worsening symptoms will consider further workup for this but at this time do not think that this is warranted.  This is discussed with the patient and she is in agreement with this plan.  The patient's labs here are reassuring.  She is feeling better on reassessment.  She will be discharged with symptomatic treatment with return precautions.  Amount and/or Complexity of Data Reviewed Labs: ordered.  Risk Prescription drug management.           Final Clinical Impression(s) / ED Diagnoses Final diagnoses:  Nausea and vomiting, unspecified vomiting type    Rx / DC Orders ED Discharge Orders          Ordered    ondansetron  (ZOFRAN -ODT) 4 MG disintegrating tablet  Every 8 hours PRN        09/30/23 1043              Carin Charleston, MD 09/30/23 1044

## 2023-09-30 NOTE — ED Triage Notes (Signed)
 C/o n/v/d since yesterday, Works in a [employer] and had known contact with patient who had noro. Denies fevers.

## 2023-09-30 NOTE — ED Notes (Signed)
 Peanut butter crackers and ginger ale provided for PO challenge.

## 2023-09-30 NOTE — Discharge Instructions (Signed)
 Your labs and workup today were reassuring.  Please take the nausea medication as needed.  If you develop abdominal pain or worsening symptoms please return to the ER for reevaluation.

## 2023-09-30 NOTE — ED Notes (Signed)
Discharge instructions, follow up care, and prescription reviewed and explained, pt verbalized understanding. Pt caox4, ambulatory, NAD on d/c.  

## 2023-10-10 ENCOUNTER — Other Ambulatory Visit (HOSPITAL_BASED_OUTPATIENT_CLINIC_OR_DEPARTMENT_OTHER): Payer: Self-pay

## 2024-02-23 DIAGNOSIS — Z419 Encounter for procedure for purposes other than remedying health state, unspecified: Secondary | ICD-10-CM | POA: Diagnosis not present

## 2024-03-05 ENCOUNTER — Ambulatory Visit
Admission: EM | Admit: 2024-03-05 | Discharge: 2024-03-05 | Disposition: A | Discharge status: Home / Self Care | Attending: Family Medicine | Admitting: Family Medicine

## 2024-03-05 DIAGNOSIS — S61012A Laceration without foreign body of left thumb without damage to nail, initial encounter: Secondary | ICD-10-CM | POA: Diagnosis not present

## 2024-03-05 MED ORDER — MUPIROCIN 2 % EX OINT: 1.0000 | TOPICAL_OINTMENT | Freq: Two times a day (BID) | CUTANEOUS | 0 refills | Status: AC

## 2024-03-05 MED ORDER — CHLORHEXIDINE GLUCONATE 4 % EX SOLN: Freq: Every day | CUTANEOUS | 0 refills | Status: AC | PRN

## 2024-03-05 NOTE — ED Triage Notes (Signed)
 Pt reports laceration to the left thumb states she was cutting an onion and her watch went off she looked at the watch and sliced her thumb in the process. Thumb is currently pulsating and numb.

## 2024-03-05 NOTE — ED Provider Notes (Signed)
 [facility]    CSN: [id number] Arrival date & time: 03/05/24  1722      History   Chief Complaint No chief complaint on file.   HPI [name] is a 38 y.o. female.   Presenting today for a laceration to the distal left thumb that occurred while cutting an onion earlier today.  Denies numbness, tingling, decreased range of motion, uncontrolled bleeding.  Has applied a pressure dressing with minimal relief.  Last tetanus shot in 2022.    Past Medical History:  Diagnosis Date   Anxiety    Bipolar disorder (HCC)    Deep vein blood clot of left lower extremity (HCC)    Family history of breast cancer    Genetic testing 05/03/2017   Common Cancers panel (47 genes) @ [facility] - No pathogenic mutations detected   Hypercholesterolemia    Myalgia    Pre-eclampsia in postpartum period    Seasonal allergies    Vitamin D deficiency     Patient Active Problem List   Diagnosis Date Noted   Shortness of breath 05/12/2021   Cardiomegaly    Postpartum state    Genetic testing 05/03/2017   Family history of breast cancer     Past Surgical History:  Procedure Laterality Date   ABDOMINAL HYSTERECTOMY     partial   CESAREAN SECTION     COLONOSCOPY  02/02/2019   [facility]   FASCIECTOMY Right 06/14/2022   Procedure: RELEASE RIGHT FIRST DORSAL COMPARTMENT;  Surgeon: Murrell Drivers, MD;  Location: [facility];  Service: Orthopedics;  Laterality: Right;  45 MIN   SALPINGECTOMY Bilateral    TONSILLECTOMY AND ADENOIDECTOMY      OB History     Gravida  1   Para  1   Term  1   Preterm      AB      Living  1      SAB      IAB      Ectopic      Multiple      Live Births  1            Home Medications    Prior to Admission medications   Medication Sig Start Date End Date Taking? Authorizing Provider  chlorhexidine (HIBICLENS) 4 % external liquid Apply topically daily as needed. 03/05/24  Yes Stuart Vernell Norris, PA-C  mupirocin ointment (BACTROBAN) 2 % Apply 1 Application topically 2 (two) times daily. 03/05/24  Yes Stuart Vernell Norris, PA-C  ARIPiprazole  (ABILIFY ) 20 MG tablet Take 20 mg by mouth at bedtime.     [provider]  cariprazine (VRAYLAR) 3 MG capsule Take 3 mg by mouth daily. 09/27/23   [provider]  lamoTRIgine  (LAMICTAL ) 25 MG tablet Take 50 mg by mouth daily.    [provider]  Melatonin 5 MG TABS Take 5 mg by mouth at bedtime.    [provider]  ondansetron  (ZOFRAN -ODT) 4 MG disintegrating tablet Dissolve 1 tablet under the tongue every 8 (eight) hours as needed for nausea or vomiting. 09/30/23   Ula Prentice SAUNDERS, MD  traMADol  (ULTRAM ) 50 MG tablet 1-2 tabs PO q6 hours prn pain 06/14/22   Kuzma, Kevin, MD  ZEPBOUND 5 MG/0.5ML Pen Inject 5 mg into the skin once a week. 09/12/23   [provider]    Family History Family History  Problem Relation Age of Onset   Cancer Mother    Ovarian cancer Mother  stage 4    Breast cancer Mother        double mastectomy in 2019   Prostate cancer Paternal Uncle        deceased 10s   Prostate cancer Paternal Uncle        deceased 37s   Colon cancer Paternal Uncle    Breast cancer Maternal Grandmother 15       2nd primary vs. recurrence in 62s   Breast cancer Paternal Grandmother    Colon cancer Paternal Grandfather    Breast cancer Cousin    Breast cancer Cousin    Breast cancer Other        mother's paternal first cousin; dx 37s   Breast cancer Other        paternal grandmother's sister; currently 27s   Esophageal cancer Neg Hx     Social History Social History   Tobacco Use   Smoking status: Never    Passive exposure: Never   Smokeless tobacco: Never  Vaping Use   Vaping status: Never Used  Substance Use Topics   Alcohol use: Yes    Alcohol/week: 2.0 standard drinks of alcohol    Types: 1 Glasses of wine, 1 Cans of beer per week    Comment: occasionally   Drug  use: No     Allergies   Dust mite extract, Pollen extract, and Cat dander   Review of Systems Review of Systems Per HPI  Physical Exam Triage Vital Signs ED Triage Vitals  Encounter Vitals Group     BP 03/05/24 1728 129/77     Girls Systolic BP Percentile --      Girls Diastolic BP Percentile --      Boys Systolic BP Percentile --      Boys Diastolic BP Percentile --      Pulse Rate 03/05/24 1728 61     Resp 03/05/24 1728 20     Temp 03/05/24 1728 98.2 F (36.8 C)     Temp Source 03/05/24 1728 Oral     SpO2 03/05/24 1728 97 %     Weight --      Height --      Head Circumference --      Peak Flow --      Pain Score 03/05/24 1738 0     Pain Loc --      Pain Education --      Exclude from Growth Chart --    No data found.  Updated Vital Signs BP 129/77 (BP Location: Right Arm)   Pulse 61   Temp 98.2 F (36.8 C) (Oral)   Resp 20   LMP 11/02/2021 (Approximate)   SpO2 97%   Visual Acuity Right Eye Distance:   Left Eye Distance:   Bilateral Distance:    Right Eye Near:   Left Eye Near:    Bilateral Near:     Physical Exam Vitals and nursing note reviewed.  Constitutional:      Appearance: Normal appearance. She is not ill-appearing.  HENT:     Head: Atraumatic.  Eyes:     Extraocular Movements: Extraocular movements intact.     Conjunctiva/sclera: Conjunctivae normal.  Cardiovascular:     Rate and Rhythm: Normal rate.  Pulmonary:     Effort: Pulmonary effort is normal.  Musculoskeletal:        General: Tenderness and signs of injury present. No swelling or deformity. Normal range of motion.     Cervical back: Normal range of motion and neck  supple.  Skin:    General: Skin is warm.     Comments: 0.5 cm superficial flap laceration to the distal left thumb, bleeding controlled with pressure.  No foreign body appreciable  Neurological:     Mental Status: She is alert and oriented to person, place, and time.     Comments: Left hand neurovascularly  intact  Psychiatric:        Mood and Affect: Mood normal.        Thought Content: Thought content normal.        Judgment: Judgment normal.      UC Treatments / Results  Labs (all labs ordered are listed, but only abnormal results are displayed) Labs Reviewed - No data to display  EKG   Radiology No results found.  Procedures Laceration Repair  Date/Time: 03/05/2024 6:36 PM  Performed by: Stuart Vernell Norris, PA-C Authorized by: Stuart Vernell Norris, PA-C   Consent:    Consent obtained:  Verbal   Consent given by:  Patient   Risks, benefits, and alternatives were discussed: yes     Risks discussed:  Pain, infection and poor cosmetic result   Alternatives discussed:  No treatment Universal protocol:    Procedure explained and questions answered to patient or proxy's satisfaction: yes     Relevant documents present and verified: yes     Patient identity confirmed:  Verbally with patient and arm band Anesthesia:    Anesthesia method:  None Laceration details:    Location:  Finger   Finger location:  L thumb   Length (cm):  0.5   Depth (mm):  2 Pre-procedure details:    Preparation:  Patient was prepped and draped in usual sterile fashion Exploration:    Limited defect created (wound extended): no     Hemostasis achieved with:  Direct pressure   Wound exploration: wound explored through full range of motion     Contaminated: no   Treatment:    Area cleansed with:  Chlorhexidine   Amount of cleaning:  Standard   Irrigation solution:  Sterile saline   Visualized foreign bodies/material removed: no   Skin repair:    Repair method:  Tissue adhesive Approximation:    Approximation:  Close Repair type:    Repair type:  Simple Post-procedure details:    Dressing:  Non-adherent dressing   Procedure completion:  Tolerated well, no immediate complications  (including critical care time)  Medications Ordered in UC Medications - No data to display  Initial  Impression / Assessment and Plan / UC Course  I have reviewed the triage vital signs and the nursing notes.  Pertinent labs & imaging results that were available during my care of the patient were reviewed by me and considered in my medical decision making (see chart for details).     Laceration cleaned, Dermabond applied and nonstick dressing placed above this.  Treat with Hibiclens, mupirocin, home wound care and return precautions reviewed.  Final Clinical Impressions(s) / UC Diagnoses   Final diagnoses:  Laceration of left thumb without foreign body without damage to nail, initial encounter     Discharge Instructions      Once the glue peels itself off, clean the area once to twice daily with Hibiclens and apply the mupirocin and a nonstick dressing.  Do this until fully healed.  Follow-up for worsening or unresolving symptoms.    ED Prescriptions     Medication Sig Dispense Auth. Provider   chlorhexidine (HIBICLENS) 4 % external liquid Apply  topically daily as needed. 236 mL Stuart Vernell Norris, PA-C   mupirocin ointment (BACTROBAN) 2 % Apply 1 Application topically 2 (two) times daily. 60 g Stuart Vernell Norris, [place]      PDMP not reviewed this encounter.   Stuart Vernell Norris, [place] 03/05/24 R[place]

## 2024-03-05 NOTE — Discharge Instructions (Signed)
 Once the glue peels itself off, clean the area once to twice daily with Hibiclens and apply the mupirocin and a nonstick dressing.  Do this until fully healed.  Follow-up for worsening or unresolving symptoms.

## 2024-05-10 ENCOUNTER — Encounter (HOSPITAL_COMMUNITY): Payer: Self-pay

## 2024-05-10 ENCOUNTER — Emergency Department (HOSPITAL_COMMUNITY)

## 2024-05-10 ENCOUNTER — Emergency Department (HOSPITAL_COMMUNITY)
Admission: EM | Admit: 2024-05-10 | Discharge: 2024-05-10 | Discharge status: Against Medical Advice | Attending: Emergency Medicine | Admitting: Emergency Medicine

## 2024-05-10 ENCOUNTER — Other Ambulatory Visit: Payer: Self-pay

## 2024-05-10 DIAGNOSIS — M79662 Pain in left lower leg: Secondary | ICD-10-CM | POA: Diagnosis present

## 2024-05-10 DIAGNOSIS — Z5321 Procedure and treatment not carried out due to patient leaving prior to being seen by health care provider: Secondary | ICD-10-CM | POA: Insufficient documentation

## 2024-05-10 NOTE — ED Notes (Signed)
 Pt told registration staff member that she was leaving. Pt witnessed leaving the department.

## 2024-05-10 NOTE — ED Triage Notes (Addendum)
 Pt presents with L calf pain x 1 week. She saw her PCP that wanted to r/o DVT, but she did not want to go for a scan at that time. The pain has progressively worsened. Pt has a sedentary job and a recent car trip to [place]  in the last month. Pt reports yesterday she had some sharp substernal CP that lasted for 5 minute while she was lying in the bed. She denies any ShOB.
